# Patient Record
Sex: Female | Born: 1950 | Race: White | Hispanic: No | Smoking: Current every day smoker
Health system: Southern US, Community
[De-identification: ages and names within clinical notes are randomized; demographics above are authoritative.]

## PROBLEM LIST (undated history)

## (undated) DIAGNOSIS — I10 Essential (primary) hypertension: Secondary | ICD-10-CM

## (undated) DIAGNOSIS — K759 Inflammatory liver disease, unspecified: Secondary | ICD-10-CM

## (undated) DIAGNOSIS — C801 Malignant (primary) neoplasm, unspecified: Secondary | ICD-10-CM

## (undated) DIAGNOSIS — F419 Anxiety disorder, unspecified: Secondary | ICD-10-CM

## (undated) DIAGNOSIS — J449 Chronic obstructive pulmonary disease, unspecified: Secondary | ICD-10-CM

## (undated) DIAGNOSIS — I6529 Occlusion and stenosis of unspecified carotid artery: Secondary | ICD-10-CM

## (undated) DIAGNOSIS — F32A Depression, unspecified: Secondary | ICD-10-CM

## (undated) DIAGNOSIS — E7800 Pure hypercholesterolemia, unspecified: Secondary | ICD-10-CM

## (undated) DIAGNOSIS — G629 Polyneuropathy, unspecified: Secondary | ICD-10-CM

## (undated) HISTORY — DX: Anxiety disorder, unspecified: F41.9

## (undated) HISTORY — DX: Chronic obstructive pulmonary disease, unspecified: J44.9

## (undated) HISTORY — DX: Depression, unspecified: F32.A

## (undated) HISTORY — DX: Pure hypercholesterolemia, unspecified: E78.00

## (undated) HISTORY — DX: Occlusion and stenosis of unspecified carotid artery: I65.29

## (undated) HISTORY — DX: Polyneuropathy, unspecified: G62.9

## (undated) HISTORY — DX: Essential (primary) hypertension: I10

---

## 2014-06-29 HISTORY — PX: MASTECTOMY: SHX3

## 2014-06-29 HISTORY — PX: PORTACATH PLACEMENT: SHX2246

## 2014-06-29 HISTORY — PX: OTHER SURGICAL HISTORY: SHX169

## 2019-06-30 DIAGNOSIS — I639 Cerebral infarction, unspecified: Secondary | ICD-10-CM

## 2019-06-30 HISTORY — DX: Cerebral infarction, unspecified: I63.9

## 2021-01-01 ENCOUNTER — Other Ambulatory Visit: Payer: Self-pay | Admitting: Nurse Practitioner

## 2021-01-01 DIAGNOSIS — R5381 Other malaise: Secondary | ICD-10-CM

## 2021-01-03 ENCOUNTER — Other Ambulatory Visit: Payer: Self-pay | Admitting: Nurse Practitioner

## 2021-01-03 DIAGNOSIS — Z780 Asymptomatic menopausal state: Secondary | ICD-10-CM

## 2021-03-07 ENCOUNTER — Other Ambulatory Visit: Payer: Self-pay | Admitting: Nurse Practitioner

## 2021-03-07 DIAGNOSIS — Z1231 Encounter for screening mammogram for malignant neoplasm of breast: Secondary | ICD-10-CM

## 2021-03-07 DIAGNOSIS — Z780 Asymptomatic menopausal state: Secondary | ICD-10-CM

## 2021-03-14 DIAGNOSIS — G5602 Carpal tunnel syndrome, left upper limb: Secondary | ICD-10-CM | POA: Insufficient documentation

## 2021-03-14 DIAGNOSIS — M79642 Pain in left hand: Secondary | ICD-10-CM | POA: Insufficient documentation

## 2021-07-03 ENCOUNTER — Encounter: Payer: Self-pay | Admitting: Neurology

## 2021-07-03 ENCOUNTER — Other Ambulatory Visit: Payer: Self-pay

## 2021-07-03 ENCOUNTER — Ambulatory Visit (INDEPENDENT_AMBULATORY_CARE_PROVIDER_SITE_OTHER): Payer: Commercial Managed Care - HMO | Admitting: Neurology

## 2021-07-03 VITALS — BP 167/107 | HR 76 | Ht 61.0 in | Wt 96.0 lb

## 2021-07-03 DIAGNOSIS — Z853 Personal history of malignant neoplasm of breast: Secondary | ICD-10-CM | POA: Insufficient documentation

## 2021-07-03 DIAGNOSIS — R29898 Other symptoms and signs involving the musculoskeletal system: Secondary | ICD-10-CM

## 2021-07-03 NOTE — Progress Notes (Addendum)
Chief Complaint  Patient presents with   New Patient (Initial Visit)    NP/Paper Proficient.[facility]/Fred Caralyn Guile MD [phone]/carpal tunnel syndrome of left wrist/ States she is concerned about stroke, unable to use left  side       ASSESSMENT AND PLAN  Wanda Harrington is a 71 y.o. female   Subacute onset of left hand weakness since June 2021,  Continue with progressive worsening left upper extremity weakness since its onset, no sensory loss, hyperreflexia on the weak left upper extremity,  In the setting of longtime smoker, right breast cancer  MRI of the brain with without contrast, and cervical spine to rule out structural abnormality, less likely due to peripheral nerve etiology   DIAGNOSTIC DATA (LABS, IMAGING, TESTING) - I reviewed patient records, labs, notes, testing and imaging myself where available.  Laboratory evaluations July 03, 2021, CMP mild elevated creatinine 1.04, normal TSH 1.94, negative HIV, RPR, MEDICAL HISTORY:  Wanda Harrington is a 71 year old right-handed female, seen in request by orthopedic surgeon Dr. Iran Planas, for evaluation of left leg weakness, her primary care physician is nurse practitioner Emelia Loron, she is accompanied by her longtime friend Larae Grooms at today's visit July 03, 2021  I reviewed and summarized the referring note. PMHX. HLD Right breast cancer in 2016, s/p lobectomy, chemo-radiation therapy, developed chemo induced peripheral neuropathy Longtime smoker  She moved from [place] to [place] in August 2021, around June 2021, while she was still at [facility], watching TV, noticed sudden onset left index and thumb weakness, no sensory change, over the next 24 hours, she developed left hand weakness, over the past couple years, she continued to noticed increased weakness of her left hand and arm, spreading to left elbow, left shoulder, now it is difficulty for her to raise left arm overhead,  She  lives with her friend, whom reported she has increased difficulty, could no longer use left hand, patient also reported generalized weakness, especially left leg, noticed some gait abnormality  She was seen by neurologist at [facility], reported left arm focal neuropathy, who referred her to orthopedic surgeon for potential decompression surgery, due to transfer of insurance reasons, she was only able to see orthopedic surgery in September 2022  I personally reviewed EMG nerve conduction study by Dr. Nelva Bush on March 06, 2021, left median sensory response showed slightly prolonged peak latency 4.4, which was also noted at left ulnar sensory response, with low normal range snap amplitude.  Left median and ulnar motor response all demonstrate mildly prolonged distal latency, well-preserved CMAP amplitude, within normal range conduction velocity.  Selected EMG of left upper extremity muscles including left first dorsal interossei, abductor pollicis brevis, biceps, triceps, deltoid was normal  Patient denies left hand sensory changes, continued complaints bilateral toes in the fingertips paresthesia this happened following her chemotherapy for breast cancer few years back, she denies bowel or bladder incontinence   PHYSICAL EXAM:   Vitals:   07/03/21 1522  Weight: 96 lb (43.5 kg)   Not recorded     There is no height or weight on file to calculate BMI.  PHYSICAL EXAMNIATION:  Gen: NAD, conversant, well nourised, well groomed                     Cardiovascular: Regular rate rhythm, no peripheral edema, warm, nontender. Eyes: Conjunctivae clear without exudates or hemorrhage Neck: Supple, no carotid bruits. Pulmonary: Clear to auscultation bilaterally   NEUROLOGICAL EXAM:  MENTAL STATUS: Speech:  Speech is normal; fluent and spontaneous with normal comprehension.  Cognition:     Orientation to time, place and person     Normal recent and remote memory     Normal Attention  span and concentration     Normal Language, naming, repeating,spontaneous speech     Fund of knowledge   CRANIAL NERVES: CN II: Visual fields are full to confrontation. Pupils are round equal and briskly reactive to light. CN III, IV, VI: extraocular movement are normal. No ptosis. CN V: Facial sensation is intact to light touch CN VII: Face is symmetric with normal eye closure  CN VIII: Hearing is normal to causal conversation. CN IX, X: Phonation is normal. CN XI: Head turning and shoulder shrug are intact  MOTOR: No significant spasticity of left upper extremity, left shoulder abduction 4, external rotation 4, elbow flexion 4, extension 4, wrist flexion 4, extension 4, grip 3, finger extension 3,  REFLEXES: Hyperreflexia of left brachioradialis, biceps, triceps, symmetric bilateral patellar, ankle reflex,  SENSORY: Intact to light touch, pinprick and vibratory sensation are intact in fingers and toes.  No sensory loss of left upper extremity,  COORDINATION: There is no trunk or limb dysmetria noted.  GAIT/STANCE: Need push-up to get up from seated position, decreased left arm swing,  REVIEW OF SYSTEMS:  Full 14 system review of systems performed and notable only for as above All other review of systems were negative.   ALLERGIES: Allergies  Allergen Reactions   Other     HOME MEDICATIONS: Current Outpatient Medications  Medication Sig Dispense Refill   ALPRAZolam (XANAX) 0.25 MG tablet Take 0.25 mg by mouth 2 (two) times daily as needed.     fluticasone (FLONASE) 50 MCG/ACT nasal spray fluticasone propionate 50 mcg/actuation nasal spray,suspension  USE ONE SPRAY IN THE AFFECTED NOSTRIL TWICE A DAY     lisinopril-hydrochlorothiazide (ZESTORETIC) 10-12.5 MG tablet lisinopril 10 mg-hydrochlorothiazide 12.5 mg tablet  TAKE ONE TABLET BY MOUTH ONE TIME DAILY     pravastatin (PRAVACHOL) 20 MG tablet Take 20 mg by mouth at bedtime.     pregabalin (LYRICA) 50 MG capsule  pregabalin 50 mg capsule  TAKE ONE CAPSULE BY MOUTH TWICE A DAY     No current facility-administered medications for this visit.    PAST MEDICAL HISTORY: History reviewed. No pertinent past medical history.  PAST SURGICAL HISTORY: History reviewed. No pertinent surgical history.  FAMILY HISTORY: History reviewed. No pertinent family history.  SOCIAL HISTORY: Social History   Socioeconomic History   Marital status: Widowed    Spouse name: Not on file   Number of children: Not on file   Years of education: Not on file   Highest education level: Not on file  Occupational History   Not on file  Tobacco Use   Smoking status: Not on file   Smokeless tobacco: Not on file  Substance and Sexual Activity   Alcohol use: Not on file   Drug use: Not on file   Sexual activity: Not on file  Other Topics Concern   Not on file  Social History Narrative   Not on file   Social Determinants of Health   Financial Resource Strain: Not on file  Food Insecurity: Not on file  Transportation Needs: Not on file  Physical Activity: Not on file  Stress: Not on file  Social Connections: Not on file  Intimate Partner Violence: Not on file      Marcial Pacas, M.D. Ph.D.  [facility] [phone]  [address], [place], [place] [address] Ph: [phone] Fax: [phone]  CC:  Iran Planas, [address] [place],  [place] [address]  Emelia Loron, NP

## 2021-07-07 ENCOUNTER — Telehealth: Payer: Self-pay | Admitting: Neurology

## 2021-07-07 LAB — COMPREHENSIVE METABOLIC PANEL
ALT: 26 IU/L (ref 0–32)
AST: 36 IU/L (ref 0–40)
Albumin/Globulin Ratio: 1.3 (ref 1.2–2.2)
Albumin: 4 g/dL (ref 3.8–4.8)
Alkaline Phosphatase: 62 IU/L (ref 44–121)
BUN/Creatinine Ratio: 21 (ref 12–28)
BUN: 22 mg/dL (ref 8–27)
Bilirubin Total: 0.2 mg/dL (ref 0.0–1.2)
CO2: 23 mmol/L (ref 20–29)
Calcium: 9.6 mg/dL (ref 8.7–10.3)
Chloride: 97 mmol/L (ref 96–106)
Creatinine, Ser: 1.04 mg/dL — ABNORMAL HIGH (ref 0.57–1.00)
Globulin, Total: 3.1 g/dL (ref 1.5–4.5)
Glucose: 82 mg/dL (ref 70–99)
Potassium: 4.9 mmol/L (ref 3.5–5.2)
Sodium: 139 mmol/L (ref 134–144)
Total Protein: 7.1 g/dL (ref 6.0–8.5)
eGFR: 58 mL/min/{1.73_m2} — ABNORMAL LOW (ref >59)

## 2021-07-07 LAB — HIV ANTIBODY (ROUTINE TESTING W REFLEX): HIV Screen 4th Generation wRfx: NONREACTIVE

## 2021-07-07 LAB — CBC WITH DIFFERENTIAL/PLATELET

## 2021-07-07 LAB — VITAMIN B12: Vitamin B-12: 679 pg/mL (ref 232–1245)

## 2021-07-07 LAB — RPR: RPR Ser Ql: NONREACTIVE

## 2021-07-07 LAB — TSH: TSH: 1.94 u[IU]/mL (ref 0.450–4.500)

## 2021-07-07 LAB — CK: Total CK: 42 U/L (ref 32–182)

## 2021-07-07 NOTE — Telephone Encounter (Signed)
Please call patient, the only abnormality on extensive laboratory evaluation was slight abnormal kidney function, she would benefit increase water intake, rest of the laboratory evaluation showed no significant abnormality  I have forwarded lab result to her primary care physician Emelia Loron, NP

## 2021-07-08 NOTE — Telephone Encounter (Signed)
Pt verified by name and DOB, results given per provider, pt voiced understanding all question answered. 

## 2021-07-15 MED ORDER — HEPARIN SOD (PORK) LOCK FLUSH 100 UNIT/ML IV SOLN: 500.0000 [IU] | Freq: Once | INTRAVENOUS | Status: DC

## 2021-07-15 MED ORDER — SODIUM CHLORIDE 0.9% FLUSH: 10.0000 mL | INTRAVENOUS | Status: DC | PRN

## 2021-07-17 ENCOUNTER — Other Ambulatory Visit: Payer: Self-pay

## 2021-07-21 ENCOUNTER — Inpatient Hospital Stay: Admission: RE | Admit: 2021-07-21 | Payer: Self-pay | Source: Ambulatory Visit

## 2021-07-21 ENCOUNTER — Inpatient Hospital Stay
Admission: RE | Admit: 2021-07-21 | Discharge: 2021-07-21 | Disposition: A | Discharge status: Home / Self Care | Payer: Self-pay | Source: Ambulatory Visit | Attending: Neurology | Admitting: Neurology

## 2021-07-22 ENCOUNTER — Telehealth: Payer: Self-pay | Admitting: Hematology and Oncology

## 2021-07-22 NOTE — Telephone Encounter (Signed)
Scheduled appt per 1/23 referral. Spoke to pt's friend, Manuela Schwartz, who is aware of appt date and time. She told me she will call me back later today to confirm everything. Appt scheduled.

## 2021-07-28 ENCOUNTER — Inpatient Hospital Stay: Admission: RE | Admit: 2021-07-28 | Payer: Self-pay | Source: Ambulatory Visit

## 2021-07-28 ENCOUNTER — Inpatient Hospital Stay: Payer: Medicare Other | Attending: Hematology and Oncology | Admitting: Hematology and Oncology

## 2021-07-28 ENCOUNTER — Ambulatory Visit (HOSPITAL_COMMUNITY)
Admission: RE | Admit: 2021-07-28 | Discharge: 2021-07-28 | Disposition: A | Discharge status: Home / Self Care | Payer: Medicare Other | Source: Ambulatory Visit | Attending: Hematology and Oncology | Admitting: Hematology and Oncology

## 2021-07-28 ENCOUNTER — Encounter: Payer: Self-pay | Admitting: Hematology and Oncology

## 2021-07-28 ENCOUNTER — Other Ambulatory Visit: Payer: Self-pay

## 2021-07-28 ENCOUNTER — Inpatient Hospital Stay: Payer: Medicare Other

## 2021-07-28 ENCOUNTER — Other Ambulatory Visit: Payer: Self-pay | Admitting: *Deleted

## 2021-07-28 VITALS — BP 131/56 | HR 70 | Temp 98.4°F | Resp 17 | Wt 95.8 lb

## 2021-07-28 DIAGNOSIS — F17210 Nicotine dependence, cigarettes, uncomplicated: Secondary | ICD-10-CM | POA: Diagnosis not present

## 2021-07-28 DIAGNOSIS — Z9221 Personal history of antineoplastic chemotherapy: Secondary | ICD-10-CM | POA: Diagnosis not present

## 2021-07-28 DIAGNOSIS — C50111 Malignant neoplasm of central portion of right female breast: Secondary | ICD-10-CM

## 2021-07-28 DIAGNOSIS — Z79811 Long term (current) use of aromatase inhibitors: Secondary | ICD-10-CM | POA: Insufficient documentation

## 2021-07-28 DIAGNOSIS — Z95828 Presence of other vascular implants and grafts: Secondary | ICD-10-CM

## 2021-07-28 DIAGNOSIS — Z170 Estrogen receptor positive status [ER+]: Secondary | ICD-10-CM

## 2021-07-28 DIAGNOSIS — Z923 Personal history of irradiation: Secondary | ICD-10-CM | POA: Diagnosis not present

## 2021-07-28 DIAGNOSIS — Z9011 Acquired absence of right breast and nipple: Secondary | ICD-10-CM | POA: Insufficient documentation

## 2021-07-28 DIAGNOSIS — F419 Anxiety disorder, unspecified: Secondary | ICD-10-CM | POA: Diagnosis not present

## 2021-07-28 DIAGNOSIS — Z853 Personal history of malignant neoplasm of breast: Secondary | ICD-10-CM

## 2021-07-28 NOTE — Progress Notes (Signed)
[facility] NOTE  Patient Care Team: Emelia Loron, NP as PCP - General (Nurse Practitioner) Iran Planas, MD as Consulting Physician (Orthopedic Surgery)  CHIEF COMPLAINTS/PURPOSE OF CONSULTATION:  Newly diagnosed breast cancer  HISTORY OF PRESENTING ILLNESS:  Wanda Harrington 71 y.o. female is here because of recent diagnosis of right breast IDC  This is a very pleasant 71 year old female patient with past medical history of T2 N1 M0 right breast IDC, stage IIb ER diagnosis, grade 2, ER 96%, PR 10%, KI of 20% negative for HER2 by IHC and FISH diagnosed in April 05, 2014 treated with neoadjuvant chemotherapy with dose dense Adriamycin and Cytoxan followed by weekly Taxol 05/03/2014 to August 30, 2014, received 9 weeks of Taxol because of neuropathy, status post right modified radical mastectomy on October 08, 2014 which revealed a 2.3 cm residual tumor with 3 out of 11 positive lymph nodes, adjuvant postmastectomy radiation completed on December 28, 2014 and adjuvant Arimidex prescribed on December 27, 2014. She was found to have some transaminitis which was evaluated with abdominal ultrasound, with no focal lesions.  She is on gabapentin for neuropathy. She recently followed up with neurology for subacute onset of left hand weakness since June 2021 and continues with progressive worsening left upper extremity weakness.Last MRI brain and MR cervical spine with no definitive evidence of cancer, recommended MRI back in 3/4 months. However patient had to move to [place] because of social circumstances and didn't have insurance for a long while, hence just re-establishing appointments. She is now seen by Neurology.  MRI brain and MRI cervical spine was ordered and this has been scheduled.  She is here with her room mate. She is very very anxious, talkative, says she was taking Arimidex regularly up until she moved to [place].  Most recently she has been taking it only few times a week and has  missed several doses.  Prior to this back in [place], she was compliant with it.  She is now establishing with all the doctors here, she says it took a while for her insurance to approve visits here and hence she did not get a chance to talk to the oncologist in the past year.  She is due for her left breast mammogram.  She is due for a bone density.  I reviewed her records extensively and collaborated the history with the patient.  SUMMARY OF ONCOLOGIC HISTORY: Oncology History  Malignant neoplasm of central portion of right breast, estrogen receptor positive (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, estrogen receptor positive ([facility])   07/28/2021 Cancer Staging   Staging form: Breast, AJCC 8th Edition - Clinical stage from 07/28/2021: Stage IIA (cT2, cN1, cM0, G2, ER+, PR+, HER2-) - Signed by Benay Pike, MD on 07/28/2021 Histologic grading system: 3 grade system       MEDICAL HISTORY:  Past Medical History:  Diagnosis Date   Anxiety    BP (high blood pressure)    Depression    High cholesterol    Neuropathy     SURGICAL HISTORY: Past Surgical History:  Procedure Laterality Date   breast cancer Right 2016   PORTACATH PLACEMENT  2016    SOCIAL HISTORY: Social History   Socioeconomic History   Marital status: Widowed    Spouse name: Not on file   Number of children: Not on file   Years of education: Not on file   Highest education level: Not on file  Occupational History   Not on file  Tobacco Use   Smoking status: Every Day    Packs/day: 1.00    Types: Cigarettes   Smokeless tobacco: Not on file  Substance and Sexual Activity   Alcohol use: Yes   Drug use: Yes    Types: Marijuana   Sexual activity: Not on file  Other Topics Concern   Not on file  Social History Narrative   Not on file   Social Determinants of Health   Financial Resource Strain: Not on file  Food Insecurity: Not on file   Transportation Needs: Not on file  Physical Activity: Not on file  Stress: Not on file  Social Connections: Not on file  Intimate Partner Violence: Not on file    FAMILY HISTORY: Family History  Adopted: Yes  Family history unknown: Yes    ALLERGIES:  is allergic to other.  MEDICATIONS:  Current Outpatient Medications  Medication Sig Dispense Refill   ALPRAZolam (XANAX) 0.25 MG tablet Take 0.25 mg by mouth 2 (two) times daily as needed.     fluticasone (FLONASE) 50 MCG/ACT nasal spray fluticasone propionate 50 mcg/actuation nasal spray,suspension  USE ONE SPRAY IN THE AFFECTED NOSTRIL TWICE A DAY     lisinopril-hydrochlorothiazide (ZESTORETIC) 10-12.5 MG tablet lisinopril 10 mg-hydrochlorothiazide 12.5 mg tablet  TAKE ONE TABLET BY MOUTH ONE TIME DAILY     pravastatin (PRAVACHOL) 20 MG tablet Take 20 mg by mouth at bedtime.     pregabalin (LYRICA) 50 MG capsule pregabalin 50 mg capsule  TAKE ONE CAPSULE BY MOUTH TWICE A DAY     No current facility-administered medications for this visit.    REVIEW OF SYSTEMS:   Constitutional: Denies fevers, chills or abnormal night sweats Eyes: Denies blurriness of vision, double vision or watery eyes Ears, nose, mouth, throat, and face: Denies mucositis or sore throat Respiratory: Denies cough, dyspnea or wheezes Cardiovascular: Denies palpitation, chest discomfort or lower extremity swelling Gastrointestinal:  Denies nausea, heartburn or change in bowel habits Skin: Denies abnormal skin rashes Lymphatics: Denies new lymphadenopathy or easy bruising Neurological:Denies numbness, tingling or new weaknesses Behavioral/Psych: Mood is stable, no new changes  Breast: Denies any palpable lumps or discharge All other systems were reviewed with the patient and are negative.  PHYSICAL EXAMINATION: ECOG PERFORMANCE STATUS: 0 - Asymptomatic  Vitals:   07/28/21 1246  BP: (!) 131/56  Pulse: 70  Resp: 17  Temp: 98.4 F (36.9 C)  SpO2: 95%    Filed Weights   07/28/21 1246  Weight: 95 lb 12.8 oz (43.5 kg)    GENERAL:alert, no distress and comfortable, very petite and anxious SKIN: skin color, texture, turgor are normal, no rashes or significant lesions EYES: normal, conjunctiva are pink and non-injected, sclera clear OROPHARYNX:no exudate, no erythema and lips, buccal mucosa, and tongue normal  NECK: supple, thyroid normal size, non-tender, without nodularity LYMPH:  no palpable lymphadenopathy in the cervical, axillary  LUNGS: clear to auscultation and percussion with normal breathing effort HEART: regular rate & rhythm and no murmurs and no lower extremity edema ABDOMEN:abdomen soft, non-tender and normal bowel sounds Musculoskeletal:no cyanosis of digits and no clubbing  PSYCH: Very anxious and stressed speech NEURO: She has weakness of left hand, able to raise her arm above the shoulder BREAST: No palpable nodules in breast. No palpable axillary or supraclavicular lymphadenopathy.  Status post right mastectomy.  No skin metastasis noted on the right side.  LABORATORY DATA:  I have reviewed the data as listed Lab Results  Component Value Date   WBC CANCELED 07/03/2021   Lab Results  Component Value Date   NA 139 07/03/2021   K 4.9 07/03/2021   CL 97 07/03/2021   CO2 23 07/03/2021    RADIOGRAPHIC STUDIES: I have personally reviewed the radiological reports and agreed with the findings in the report.  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  This was diagnosed back in October 2015.  She underwent neoadjuvant chemotherapy  with dose dense Adriamycin and Cytoxan followed by weekly Taxol, completed 9 weeks of Taxol and discontinued because of neuropathy.  She underwent right modified radical mastectomy in April 2016 which revealed 2.3 cm residual tumor with 3 out of 11 positive lymph nodes.  Final pathologic staging pT2 pN1 M0, stage IIb.  She received adjuvant postmastectomy radiation which completed in July 2016.  She was prescribed adjuvant Arimidex in June 2016.  She reported some left upper extremity weakness back in June 2021 and had an MRI brain and MRI cervical spine which showed no definitive evidence of metastatic disease according to the report.  She was recommended to have follow-up MRIs but she could not do these, she had to move out of [place] because of lack of place to live.  She is now reestablishing with all the doctors after about a year and a half of moving to [place], she tells me that it took a while for her to have her insurance established.  She continues with left upper extremity weakness, recently saw neurology.  According to the patient left upper extremity weakness has improved since last visit with her doctor back in [place].  She is scheduled for MRI brain and MRI cervical spine again. She is extremely anxious, talks loudly and with stressed speech and apparently showed took a Xanax even before she came to the appointment.  She wanted the port to be flushed so she can get the MRI brain. She was hoping to get caught up with her left breast mammogram.  She needs a bone density, this has already been ordered by her nurse practitioner. I have recommended that she continue Arimidex daily, return to clinic to follow-up with me after her MRI imaging.  Her clinical presentation is not quite consistent with metastatic disease since her left upper extremity weakness actually improved according to the patient.   Question she is however at risk for distant metastasis given noncompliance as well  as multiple positive lymph nodes on final pathology. She will return to clinic in 4 weeks.  She will have to follow-up with her PCP for management of her anxiety and her other chronic medical comorbidities.   All questions were answered. The patient knows to call the clinic with any problems, questions or concerns. I spent 60 minutes in the care of this patient reviewing her history, previous medical records, counseling, coordination of care.    Benay Pike, MD 07/28/21

## 2021-07-28 NOTE — Assessment & Plan Note (Signed)
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  This was diagnosed back in October 2015.  She underwent neoadjuvant chemotherapy with dose dense Adriamycin and Cytoxan followed by weekly Taxol, completed 9 weeks of Taxol and discontinued because of neuropathy.  She underwent right modified radical mastectomy in April 2016 which revealed 2.3 cm residual tumor with 3 out of 11 positive lymph nodes.  Final pathologic staging pT2 pN1 M0, stage IIb.  She received adjuvant postmastectomy radiation which completed in July 2016.  She was prescribed adjuvant Arimidex in June 2016.  She reported some left upper extremity weakness back in June 2021 and had an MRI brain and MRI cervical spine which showed no definitive evidence of metastatic disease according to the report.  She was recommended to have follow-up MRIs but she could not do these, she had to move out of [place] because of lack of place to live.  She is now reestablishing with all the doctors after about a year and a half of moving to [place], she tells me that it took a while for her to have her insurance established.  She continues with left upper extremity weakness, recently saw neurology.  According to the patient left upper extremity weakness has improved since last visit with her doctor back in [place].  She is scheduled for MRI brain and MRI cervical spine again. She is extremely anxious, talks loudly and with stressed speech and apparently showed took a Xanax even before she came to the appointment.  She wanted the port to be flushed so she can get the MRI brain. She was hoping to get caught up with her left breast mammogram.  She needs a bone density, this  has already been ordered by her nurse practitioner. I have recommended that she continue Arimidex daily, return to clinic to follow-up with me after her MRI imaging.  Her clinical presentation is not quite consistent with metastatic disease since her left upper extremity weakness actually improved according to the patient.   Question she is however at risk for distant metastasis given noncompliance as well as multiple positive lymph nodes on final pathology. She will return to clinic in 4 weeks.  She will have to follow-up with her PCP for management of her anxiety and her other chronic medical comorbidities.

## 2021-07-29 ENCOUNTER — Telehealth: Payer: Self-pay | Admitting: Hematology and Oncology

## 2021-07-29 NOTE — Telephone Encounter (Signed)
Sch per 1/30 inbasket, pt daughter aware

## 2021-07-31 ENCOUNTER — Other Ambulatory Visit: Payer: Self-pay | Admitting: *Deleted

## 2021-07-31 ENCOUNTER — Other Ambulatory Visit: Payer: Medicare Other

## 2021-07-31 MED ORDER — ANASTROZOLE 1 MG PO TABS: 1.0000 mg | ORAL_TABLET | Freq: Every day | ORAL | 3 refills | Status: AC

## 2021-08-01 ENCOUNTER — Inpatient Hospital Stay: Payer: Medicare Other

## 2021-08-04 ENCOUNTER — Other Ambulatory Visit: Payer: Self-pay

## 2021-08-04 ENCOUNTER — Inpatient Hospital Stay: Payer: Medicare Other | Attending: Hematology and Oncology

## 2021-08-04 DIAGNOSIS — Z170 Estrogen receptor positive status [ER+]: Secondary | ICD-10-CM | POA: Insufficient documentation

## 2021-08-04 DIAGNOSIS — C50111 Malignant neoplasm of central portion of right female breast: Secondary | ICD-10-CM | POA: Diagnosis present

## 2021-08-04 DIAGNOSIS — Z95828 Presence of other vascular implants and grafts: Secondary | ICD-10-CM

## 2021-08-04 DIAGNOSIS — Z853 Personal history of malignant neoplasm of breast: Secondary | ICD-10-CM

## 2021-08-04 LAB — CBC WITH DIFFERENTIAL/PLATELET
Abs Immature Granulocytes: 0.02 10*3/uL (ref 0.00–0.07)
Basophils Absolute: 0.1 10*3/uL (ref 0.0–0.1)
Basophils Relative: 1 %
Eosinophils Absolute: 0.2 10*3/uL (ref 0.0–0.5)
Eosinophils Relative: 2 %
HCT: 42.7 % (ref 36.0–46.0)
Hemoglobin: 14 g/dL (ref 12.0–15.0)
Immature Granulocytes: 0 %
Lymphocytes Relative: 36 %
Lymphs Abs: 2.4 10*3/uL (ref 0.7–4.0)
MCH: 30.2 pg (ref 26.0–34.0)
MCHC: 32.8 g/dL (ref 30.0–36.0)
MCV: 92 fL (ref 80.0–100.0)
Monocytes Absolute: 0.6 10*3/uL (ref 0.1–1.0)
Monocytes Relative: 8 %
Neutro Abs: 3.5 10*3/uL (ref 1.7–7.7)
Neutrophils Relative %: 53 %
Platelets: 202 10*3/uL (ref 150–400)
RBC: 4.64 MIL/uL (ref 3.87–5.11)
RDW: 12.6 % (ref 11.5–15.5)
WBC: 6.7 10*3/uL (ref 4.0–10.5)
nRBC: 0 % (ref 0.0–0.2)

## 2021-08-04 LAB — COMPREHENSIVE METABOLIC PANEL
ALT: 23 U/L (ref 0–44)
AST: 27 U/L (ref 15–41)
Albumin: 3.9 g/dL (ref 3.5–5.0)
Alkaline Phosphatase: 52 U/L (ref 38–126)
Anion gap: 7 (ref 5–15)
BUN: 25 mg/dL — ABNORMAL HIGH (ref 8–23)
CO2: 28 mmol/L (ref 22–32)
Calcium: 9.4 mg/dL (ref 8.9–10.3)
Chloride: 103 mmol/L (ref 98–111)
Creatinine, Ser: 1.2 mg/dL — ABNORMAL HIGH (ref 0.44–1.00)
GFR, Estimated: 49 mL/min — ABNORMAL LOW (ref >60)
Glucose, Bld: 90 mg/dL (ref 70–99)
Potassium: 4.7 mmol/L (ref 3.5–5.1)
Sodium: 138 mmol/L (ref 135–145)
Total Bilirubin: 0.4 mg/dL (ref 0.3–1.2)
Total Protein: 7.2 g/dL (ref 6.5–8.1)

## 2021-08-04 MED ORDER — HEPARIN SOD (PORK) LOCK FLUSH 10 UNIT/ML IV SOLN: 10.0000 [IU] | Freq: Once | INTRAVENOUS | Status: DC

## 2021-08-04 MED ORDER — SODIUM CHLORIDE 0.9% FLUSH
10.0000 mL | Freq: Once | INTRAVENOUS | Status: AC
  Administered 2021-08-04: 10 mL via INTRAVENOUS

## 2021-08-05 LAB — CANCER ANTIGEN 15-3: CA 15-3: 30 U/mL — ABNORMAL HIGH (ref 0.0–25.0)

## 2021-08-06 ENCOUNTER — Ambulatory Visit
Admission: RE | Admit: 2021-08-06 | Discharge: 2021-08-06 | Disposition: A | Discharge status: Home / Self Care | Payer: Medicare Other | Source: Ambulatory Visit | Attending: Neurology | Admitting: Neurology

## 2021-08-06 ENCOUNTER — Other Ambulatory Visit: Payer: Self-pay

## 2021-08-06 DIAGNOSIS — R29898 Other symptoms and signs involving the musculoskeletal system: Secondary | ICD-10-CM

## 2021-08-06 DIAGNOSIS — Z853 Personal history of malignant neoplasm of breast: Secondary | ICD-10-CM

## 2021-08-06 MED ORDER — SODIUM CHLORIDE 0.9% FLUSH
10.0000 mL | INTRAVENOUS | Status: DC | PRN
  Administered 2021-08-06: 10 mL via INTRAVENOUS

## 2021-08-06 MED ORDER — HEPARIN SOD (PORK) LOCK FLUSH 100 UNIT/ML IV SOLN: 500.0000 [IU] | Freq: Once | INTRAVENOUS | Status: DC

## 2021-08-06 MED ORDER — GADOBENATE DIMEGLUMINE 529 MG/ML IV SOLN
8.0000 mL | Freq: Once | INTRAVENOUS | Status: AC | PRN
  Administered 2021-08-06: 8 mL via INTRAVENOUS

## 2021-08-11 ENCOUNTER — Telehealth: Payer: Self-pay | Admitting: Neurology

## 2021-08-11 DIAGNOSIS — I63311 Cerebral infarction due to thrombosis of right middle cerebral artery: Secondary | ICD-10-CM | POA: Insufficient documentation

## 2021-08-11 NOTE — Telephone Encounter (Signed)
ready to be scheduled HC medicare/medicaid no Roxanna Mew, sent Butch Penny  a message she will reach out to the patient to schedule.

## 2021-08-11 NOTE — Telephone Encounter (Signed)
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 -Right internal carotid artery flow void signal abnormality may be related to proximal stenosis or occlusion. -Mild chronic small vessel ischemic disease. -No acute findings.    MRI cervical spine (with and without) demonstrating: - At C5-6 uncovertebral joint hypertrophy at moderate bilateral foraminal stenosis. - At C3-4 disc bulging and uncovertebral joint hypertrophy with mild bilateral foraminal stenosis.  Please call patient, MRI of cervical spine showed mild degenerative changes, there is no evidence of the spinal cord or nerve root conduction  MRI of the brain showed evidence of chronic stroke at the right frontal lobe, in charge of left hand function, the findings could explain her sudden onset left hand weakness in June 2021  Will complete stroke work-up with echocardiogram, ultrasound of carotid artery  Make sure she start aspirin 81 mg daily

## 2021-08-11 NOTE — Telephone Encounter (Signed)
Spoke with patient's friend Concha Pyo (on [place]) who was accompanying patient. Informed them of results of the MRI findings. Instructed patient to start aspirin 81 mg daily. She verbalized understanding and expressed appreciation for the call. All questions answered.

## 2021-08-11 NOTE — Telephone Encounter (Signed)
error 

## 2021-08-19 ENCOUNTER — Other Ambulatory Visit (HOSPITAL_COMMUNITY): Payer: Medicare Other

## 2021-08-19 ENCOUNTER — Encounter (HOSPITAL_COMMUNITY): Payer: Medicare Other

## 2021-08-20 ENCOUNTER — Telehealth: Payer: Self-pay | Admitting: Neurology

## 2021-08-20 ENCOUNTER — Ambulatory Visit (HOSPITAL_COMMUNITY)
Admission: RE | Admit: 2021-08-20 | Discharge: 2021-08-20 | Disposition: A | Discharge status: Home / Self Care | Payer: Medicare Other | Source: Ambulatory Visit | Attending: Neurology | Admitting: Neurology

## 2021-08-20 ENCOUNTER — Ambulatory Visit (HOSPITAL_BASED_OUTPATIENT_CLINIC_OR_DEPARTMENT_OTHER)
Admission: RE | Admit: 2021-08-20 | Discharge: 2021-08-20 | Disposition: A | Discharge status: Home / Self Care | Payer: Medicare Other | Source: Ambulatory Visit | Attending: Neurology | Admitting: Neurology

## 2021-08-20 ENCOUNTER — Other Ambulatory Visit: Payer: Self-pay

## 2021-08-20 DIAGNOSIS — I63311 Cerebral infarction due to thrombosis of right middle cerebral artery: Secondary | ICD-10-CM

## 2021-08-20 DIAGNOSIS — F17210 Nicotine dependence, cigarettes, uncomplicated: Secondary | ICD-10-CM | POA: Diagnosis not present

## 2021-08-20 DIAGNOSIS — I6501 Occlusion and stenosis of right vertebral artery: Secondary | ICD-10-CM | POA: Diagnosis not present

## 2021-08-20 DIAGNOSIS — I10 Essential (primary) hypertension: Secondary | ICD-10-CM | POA: Insufficient documentation

## 2021-08-20 DIAGNOSIS — I358 Other nonrheumatic aortic valve disorders: Secondary | ICD-10-CM

## 2021-08-20 DIAGNOSIS — I998 Other disorder of circulatory system: Secondary | ICD-10-CM | POA: Insufficient documentation

## 2021-08-20 DIAGNOSIS — E785 Hyperlipidemia, unspecified: Secondary | ICD-10-CM | POA: Insufficient documentation

## 2021-08-20 DIAGNOSIS — I6523 Occlusion and stenosis of bilateral carotid arteries: Secondary | ICD-10-CM

## 2021-08-20 DIAGNOSIS — R29898 Other symptoms and signs involving the musculoskeletal system: Secondary | ICD-10-CM

## 2021-08-20 LAB — ECHOCARDIOGRAM COMPLETE
AR max vel: 2.57 cm2
AV Peak grad: 4.6 mmHg
Ao pk vel: 1.07 m/s
Area-P 1/2: 3.12 cm2
S' Lateral: 2.4 cm

## 2021-08-20 NOTE — Telephone Encounter (Signed)
You can call patient Wanda Harrington for ECHO report, there was no significant abnormalities noticed,   But, please check with her about vascular surgeon appointment

## 2021-08-20 NOTE — Progress Notes (Signed)
Carotid duplex bilateral study completed.  Preliminary results relayed to Krista Blue, MD.  See CV Proc for preliminary results report.   Darlin Coco, RDMS, RVT

## 2021-08-20 NOTE — Telephone Encounter (Signed)
Called and spoke with patient and friend Manuela Schwartz (on [place]) informed of normal results. Pt has not heard from vascular surgery yet. She will call back when she hears from vascular surgery.

## 2021-08-20 NOTE — Telephone Encounter (Signed)
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 Trula Slade, on call phone [phone],  consult No, [phone], symptomatic high grade bilateral IC stenosis, urgent refer to vascular surgeon.   I was able to talk with patient on [phone], she started to take asa since Aug 13 2021, advised her drinks a lot water, >=64 oz/day.  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 -Right internal carotid artery flow void signal abnormality may be related to proximal stenosis or occlusion. -Mild chronic small vessel ischemic disease. -No acute findings.

## 2021-08-20 NOTE — Telephone Encounter (Signed)
Referral sent to [facility].

## 2021-08-25 ENCOUNTER — Inpatient Hospital Stay: Payer: Medicare Other | Admitting: Hematology and Oncology

## 2021-08-25 ENCOUNTER — Encounter: Payer: Medicare Other | Admitting: Surgery

## 2021-09-01 NOTE — Telephone Encounter (Signed)
Pt's friend, Concha Pyo (on [place]) no one has called from vascular surgery. Would like a call from the nurse. ?

## 2021-09-02 NOTE — Telephone Encounter (Signed)
I called [facility] but they informed me that the lady that does incoming referrals Mingo Amber is out of the office and will not be back until Thursday.  ?

## 2021-09-04 NOTE — Telephone Encounter (Signed)
I called [facility] and they are going to contact the patient to schedule.  ?

## 2021-09-08 ENCOUNTER — Encounter: Payer: Self-pay | Admitting: Adult Health

## 2021-09-08 ENCOUNTER — Ambulatory Visit (INDEPENDENT_AMBULATORY_CARE_PROVIDER_SITE_OTHER): Payer: Medicare Other | Admitting: Adult Health

## 2021-09-08 ENCOUNTER — Other Ambulatory Visit: Payer: Self-pay

## 2021-09-08 VITALS — BP 109/58 | HR 75 | Ht 61.0 in | Wt 100.0 lb

## 2021-09-08 DIAGNOSIS — I6523 Occlusion and stenosis of bilateral carotid arteries: Secondary | ICD-10-CM | POA: Diagnosis not present

## 2021-09-08 DIAGNOSIS — I63311 Cerebral infarction due to thrombosis of right middle cerebral artery: Secondary | ICD-10-CM | POA: Diagnosis not present

## 2021-09-08 DIAGNOSIS — E785 Hyperlipidemia, unspecified: Secondary | ICD-10-CM | POA: Diagnosis not present

## 2021-09-08 DIAGNOSIS — Z720 Tobacco use: Secondary | ICD-10-CM

## 2021-09-08 DIAGNOSIS — R531 Weakness: Secondary | ICD-10-CM

## 2021-09-08 MED ORDER — ATORVASTATIN CALCIUM 80 MG PO TABS: 80.0000 mg | ORAL_TABLET | Freq: Every day | ORAL | 3 refills | Status: AC

## 2021-09-08 NOTE — Progress Notes (Signed)
[facility] [address] [place]. [place] [address] [phone]       OFFICE FOLLOW UP NOTE  Ms. Wanda Harrington Date of Birth:  Sep 08, 1950 Medical Record Number:  [id number]   Reason for visit: Progressive left hand weakness    SUBJECTIVE:   CHIEF COMPLAINT:  Chief Complaint  Patient presents with   Follow-up    RM 2 with friend Wanda Harrington  Pt is well and stable.     HPI:   Update 09/08/2021 JM: 71 year old female who returns for follow-up visit regarding left hand weakness after prior initial consult visit with Dr. Krista Blue 2 months ago.  She is accompanied by her friend, Wanda Harrington.  Extensive lab work completed after prior visit which was largely unremarkable.  MR cervical showed mild degenerative changes but otherwise unremarkable.  MRI brain showed chronic infarct in right frontal lobe.  Recommend initiating aspirin 81 mg daily and completed further stroke work-up including 2D echo with EF 55 to 60% and carotid ultrasound which showed right ICA 80 to 99% stenosis with ECA > 50% stenosis, left ICA 80 to 99% stenosis with ECA <50% stenosis, L VA antegrade flow, R VA occlusion and normal flow within the subclavian arteries bilaterally.  She was referred to vascular surgery but was a "no-show" for initial consult visit on 2/27 - per patient and friend, they were not aware of this appt being scheduled and did not receive any call.  Reports continued left arm weakness and tightness sensation. No new neurological or stroke type symptoms.  Compliant on aspirin 81 mg daily, denies side effects. Also on pravastatin which she reports she has been on for "many years", unsure when last cholesterol level was. Blood pressure today 109/58, does not routinely monitor at home.  Continued tobacco use but trying to decrease amt, currently at <1 PPD. Reports difficulty quitting smoking due to increased stressors.  No further concerns at this time.     HISTORY (copied from Dr. Rhea Belton initial  consult visit on 07/03/2021) Wanda Harrington is a 71 year old right-handed female, seen in request by orthopedic surgeon Dr. Caralyn Guile, Josph Macho, for evaluation of left leg weakness, her primary care physician is nurse practitioner Emelia Loron, she is accompanied by her longtime friend Wanda Harrington at today's visit July 03, 2021   I reviewed and summarized the referring note. PMHX. HLD Right breast cancer in 2016, s/p lobectomy, chemo-radiation therapy, developed chemo induced peripheral neuropathy Longtime smoker   She moved from [place] to [place] in August 2021, around June 2021, while she was still at [facility], watching TV, noticed sudden onset left index and thumb weakness, no sensory change, over the next 24 hours, she developed left hand weakness, over the past couple years, she continued to noticed increased weakness of her left hand and arm, spreading to left elbow, left shoulder, now it is difficulty for her to raise left arm overhead,  She lives with her friend, whom reported she has increased difficulty, could no longer use left hand, patient also reported generalized weakness, especially left leg, noticed some gait abnormality  She was seen by neurologist at [facility], reported left arm focal neuropathy, who referred her to orthopedic surgeon for potential decompression surgery, due to transfer of insurance reasons, she was only able to see orthopedic surgery in September 2022  I personally reviewed EMG nerve conduction study by Dr. Nelva Bush on March 06, 2021, left median sensory response showed slightly prolonged peak latency 4.4, which was also noted at left ulnar  sensory response, with low normal range snap amplitude.  Left median and ulnar motor response all demonstrate mildly prolonged distal latency, well-preserved CMAP amplitude, within normal range conduction velocity.  Selected EMG of left upper extremity muscles including left first dorsal interossei,  abductor pollicis brevis, biceps, triceps, deltoid was normal  Patient denies left hand sensory changes, continued complaints bilateral toes in the fingertips paresthesia this happened following her chemotherapy for breast cancer few years back, she denies bowel or bladder incontinence     PERTINENT IMAGING  MR BRAIN W WO CONTRAST 08/06/2021 MRI brain (with and without) demonstrating: -Chronic right posterior frontal cortical ischemic infarction along the primary motor strip. -Right internal carotid artery flow void signal abnormality may be related to proximal stenosis or occlusion. -Mild chronic small vessel ischemic disease. -No acute findings.  MR CERVICAL SPINE 08/06/2021 IMPRESSION:  MRI cervical spine (with and without) demonstrating: - At C5-6 uncovertebral joint hypertrophy at moderate bilateral foraminal stenosis. - At C3-4 disc bulging and uncovertebral joint hypertrophy with mild bilateral foraminal stenosis.  VAS CAROTID DUPLEX 08/20/2021 Summary:  Right Carotid: Velocities in the right ICA are consistent with a 80-99% stenosis. The ECA appears >50% stenosed.  Left Carotid: Velocities in the left ICA are consistent with a 80-99% stenosis. The ECA appears <50% stenosed.  Vertebrals:  Left vertebral artery demonstrates antegrade flow. Right vertebral artery demonstrates an occlusion.  Subclavians: Normal flow hemodynamics were seen in bilateral subclavian arteries.    TTE 08/20/2021 IMPRESSIONS  1. Left ventricular ejection fraction, by estimation, is 55 to 60%. The  left ventricle has normal function. The left ventricle has no regional  wall motion abnormalities. Left ventricular diastolic parameters were  normal.   2. Right ventricular systolic function is normal. The right ventricular  size is normal.   3. The mitral valve is normal in structure. No evidence of mitral valve  regurgitation.   4. The aortic valve is tricuspid. Aortic valve regurgitation is not   visualized. Aortic valve sclerosis is present, with no evidence of aortic  valve stenosis.        ROS:   14 system review of systems performed and negative with exception of those listed in HPI  PMH:  Past Medical History:  Diagnosis Date   Anxiety    BP (high blood pressure)    Depression    High cholesterol    Neuropathy     PSH:  Past Surgical History:  Procedure Laterality Date   breast cancer Right 2016   PORTACATH PLACEMENT  2016    Social History:  Social History   Socioeconomic History   Marital status: Widowed    Spouse name: Not on file   Number of children: Not on file   Years of education: Not on file   Highest education level: Not on file  Occupational History   Not on file  Tobacco Use   Smoking status: Every Day    Packs/day: 1.00    Types: Cigarettes   Smokeless tobacco: Not on file  Substance and Sexual Activity   Alcohol use: Yes   Drug use: Yes    Types: Marijuana   Sexual activity: Not on file  Other Topics Concern   Not on file  Social History Narrative   Not on file   Social Determinants of Health   Financial Resource Strain: Not on file  Food Insecurity: Not on file  Transportation Needs: Not on file  Physical Activity: Not on file  Stress: Not on file  Social Connections: Not on file  Intimate Partner Violence: Not on file    Family History:  Family History  Adopted: Yes  Family history unknown: Yes    Medications:   Current Outpatient Medications on File Prior to Visit  Medication Sig Dispense Refill   ALPRAZolam (XANAX) 0.25 MG tablet Take 0.25 mg by mouth 2 (two) times daily as needed.     anastrozole (ARIMIDEX) 1 MG tablet Take 1 tablet (1 mg total) by mouth daily. 30 tablet 3   aspirin EC 81 MG tablet Take 81 mg by mouth daily. Swallow whole.     fluticasone (FLONASE) 50 MCG/ACT nasal spray fluticasone propionate 50 mcg/actuation nasal spray,suspension  USE ONE SPRAY IN THE AFFECTED NOSTRIL TWICE A DAY      lisinopril-hydrochlorothiazide (ZESTORETIC) 10-12.5 MG tablet lisinopril 10 mg-hydrochlorothiazide 12.5 mg tablet  TAKE ONE TABLET BY MOUTH ONE TIME DAILY     pregabalin (LYRICA) 50 MG capsule pregabalin 50 mg capsule  TAKE ONE CAPSULE BY MOUTH TWICE A DAY     No current facility-administered medications on file prior to visit.    Allergies:   Allergies  Allergen Reactions   Aleve [Naproxen] Rash   Latex Rash      OBJECTIVE:  Physical Exam  Vitals:   09/08/21 1555  BP: (!) 109/58  Pulse: 75  Weight: 100 lb (45.4 kg)  Height: '5\' 1"'$  (1.549 m)   Body mass index is 18.89 kg/m. No results found.  General: Frail pleasantly anxious elderly Caucasian female, strong smoke odor, seated, in no evident distress Head: head normocephalic and atraumatic.   Neck: supple with no carotid or supraclavicular bruits Cardiovascular: regular rate and rhythm, no murmurs Musculoskeletal: no deformity Skin:  no rash/petichiae Vascular:  Normal pulses all extremities   Neurologic Exam Mental Status: Awake and fully alert. mild dysarthria although poor denture. Oriented to place and time. Recent and remote memory intact. Attention span, concentration and fund of knowledge appropriate. Mood and affect appropriate.  Cranial Nerves: Pupils equal, briskly reactive to light. Extraocular movements full without nystagmus. Visual fields full to confrontation. Hearing intact. Facial sensation intact. Face, tongue, palate moves normally and symmetrically.  Motor: Normal strength, bulk and tone right upper and lower extremity LUE: 4/5 proximal, 3/4 hand drip, decreased ROM throughout, decreased hand movement with spasticity spasticity throughout  LLE: 4+/5 HF otherwise 5/5 Sensory.: intact to touch , pinprick , position and vibratory sensation.  Coordination: Rapid alternating movements normal on right side. Finger-to-nose and heel-to-shin performed accurately right side and noted Left sided dysmetria  Gait  and Station: Arises from chair without difficulty. Stance is normal. Gait demonstrates decreased step height and stride length LLE with mild unsteadiness. No AD used. Tandem walk and heel toe not attempted.  Reflexes: 3+ LUE otherwise 1+. Toes downgoing.         ASSESSMENT: Mylah Baynes is a 71 y.o. year old female chronic progressive left arm weakness since 2019 with MR brain 07/2021 showing chronic R frontal lobe stroke. Further stroke work up showed b/l ICA 80 to 99% stenosis and R VA occlusion.  Vascular risk factors include HTN, HLD, tobacco use, and carotid stenosis.      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  Referral placed to PT/OT - did discuss unknown benefit as symptoms persistent/progressive since 2019. May consider f/u with Dr. Krista Blue to discuss Botox for spasticity if symptoms persist after therapy sessions.   Continue aspirin 81 mg daily  and switch pravastatin to atorvastatin 80 mg daily for secondary stroke prevention and in setting of bilateral carotid stenosis.   Will check lipid panel and A1c today Discussed secondary stroke prevention measures and importance of close PCP follow up for aggressive stroke risk factor management. I have gone over the pathophysiology of stroke, warning signs and symptoms, risk factors and their management in some detail with instructions to go to the closest emergency room for symptoms of concern. B/l carotid stenosis: provided office number for Dr. Trula Slade to reschedule initial consult visit.  Discussed urgency of this visit due to severe bilateral narrowing HTN: BP goal 130-150 to ensure adequate perfusion.  On low side today -discussed importance of routine monitoring at home and follow-up with PCP if BP remains on low side for further medication adjustment HLD: LDL goal <70. Switch pravastatin to atorvastatin. Check cholesterol levels today Tobacco use: discussed importance of complete tobacco  cessation as continued use greatly increases risk of additional strokes and potential complete occlusion of carotid arteries. She was advised to further discuss with PCP for further assistance in quitting    Follow up in 4 months or call earlier if needed   CC:  [place] provider: Dr. Krista Blue PCP: Emelia Loron, NP    I spent 38 minutes of face-to-face and non-face-to-face time with patient and friend.  This included previsit chart review, lab review, study review, order entry, electronic health record documentation, patient and friend education and discussion regarding further work-up showing old stroke possibly because of left-sided symptoms, further treatment plan as advised above, secondary stroke prevention measures and importance of managing stroke risk factors and answered all other questions to patient and friends satisfaction   Frann Rider, AGNP-BC  [facility] [address] [place], [place] [address]  Phone [phone] Fax [phone] Note: This document was prepared with digital dictation and possible smart phrase technology. Any transcriptional errors that result from this process are unintentional.

## 2021-09-08 NOTE — Patient Instructions (Addendum)
Please call to reschedule initial visit with Dr. Trula Slade - office number [phone] ? ?Referral placed to occupational rehab for hopeful improvement of left hand symptoms  ? ?Continue aspirin 81 mg daily  and stop pravastatin to atorvastatin '80mg'$  daily for secondary stroke prevention ? ?Continue to increase water  intake to at least 64 oz of water per day  ? ?Very important to stop smoking as continue tobacco use greatly increasing your risk of additional strokes and worsening narrowing in your carotid arteries  ? ?Continue to follow up with PCP regarding cholesterol and blood pressure management  ?Maintain strict control of hypertension with blood pressure between 130-150 and cholesterol with LDL cholesterol (bad cholesterol) goal below 70 mg/dL.  ? ?Signs of a Stroke? Follow the BEFAST method:  ?Balance Watch for a sudden loss of balance, trouble with coordination or vertigo ?Eyes Is there a sudden loss of vision in one or both eyes? Or double vision?  ?Face: Ask the person to smile. Does one side of the face droop or is it numb?  ?Arms: Ask the person to raise both arms. Does one arm drift downward? Is there weakness or numbness of a leg? ?Speech: Ask the person to repeat a simple phrase. Does the speech sound slurred/strange? Is the person confused ? ?Time: If you observe any of these signs, call 911. ? ? ? ? ?Followup in the future with me in 4 months or call earlier if needed ? ? ? ? ? ?Thank you for coming to see [place] at [facility]. I hope we have been able to provide you high quality care today. ? ?You may receive a patient satisfaction survey over the next few weeks. We would appreciate your feedback and comments so that we may continue to improve ourselves and the health of our patients. ? ?

## 2021-09-09 ENCOUNTER — Telehealth: Payer: Self-pay | Admitting: *Deleted

## 2021-09-09 LAB — LIPID PANEL
Chol/HDL Ratio: 3.4 ratio (ref 0.0–4.4)
Cholesterol, Total: 190 mg/dL (ref 100–199)
HDL: 56 mg/dL (ref >39)
LDL Chol Calc (NIH): 119 mg/dL — ABNORMAL HIGH (ref 0–99)
Triglycerides: 84 mg/dL (ref 0–149)
VLDL Cholesterol Cal: 15 mg/dL (ref 5–40)

## 2021-09-09 LAB — HEMOGLOBIN A1C
Est. average glucose Bld gHb Est-mCnc: 114 mg/dL
Hgb A1c MFr Bld: 5.6 % (ref 4.8–5.6)

## 2021-09-09 NOTE — Telephone Encounter (Signed)
LVM informing patient Please that recent cholesterol levels showed LDL or bad cholesterol at 119 with goal of less than 70. I advised she be sure to start atorvastatin 80 mg daily as Janett Billow advised at [place] visit. Advised she request f/u with PCP in the next 2-3 months for repeat cholesterol level check. Her A1c looked good and no evidence of diabetes. Left # for questions. ?

## 2021-09-10 NOTE — Progress Notes (Signed)
Chart reviewed, agree above plan ?

## 2021-09-11 ENCOUNTER — Ambulatory Visit: Payer: Commercial Managed Care - HMO | Admitting: Adult Health

## 2021-09-17 NOTE — Telephone Encounter (Signed)
Patient called in today stating they have been unable to contact Vascular surgery to schedule an appt. That she has called multiple times here and every number we have given does not work. Dr. Krista Blue had put in her notes to Dr. Trula Slade so I reached out to his office to see if they would reach out to patient directly to schedule-she let me know that referral was actually sent to [facility]. Referral has been sitting in that queue since 08/20/21 and no one has reached out to schedule. I wanted to check and see where this was meant to be sent? I will switch referral over to Dr. Stephens Shire office and they stated they'll review and call to schedule once received. Please advise how you'd like me to proceed thanks ?

## 2021-09-24 NOTE — Telephone Encounter (Signed)
Noted, thank you! It was sent via epic.  ?

## 2021-09-24 NOTE — Telephone Encounter (Signed)
Wanda Harrington the patient caregiver left a voicemail on my phone stating that the referral was sent to [facility], then it was sent to [facility] but now they want it sent back to [facility] for Dr. Antony Odea. She stated she spoke with the [facility] office and they informed her to let us know that we need to put a new referral in for Dr. Antony Odea in the [place] office.  ?

## 2021-09-24 NOTE — Addendum Note (Signed)
Addended by: Verlin Grills on: 09/24/2021 07:55 AM ? ? Modules accepted: Orders ? ?

## 2021-09-24 NOTE — Telephone Encounter (Signed)
I have placed referral

## 2021-09-25 ENCOUNTER — Inpatient Hospital Stay: Payer: Medicare Other | Admitting: Hematology and Oncology

## 2021-09-29 ENCOUNTER — Encounter: Payer: Self-pay | Admitting: Surgery

## 2021-09-29 ENCOUNTER — Ambulatory Visit (INDEPENDENT_AMBULATORY_CARE_PROVIDER_SITE_OTHER): Payer: Medicare Other | Admitting: Surgery

## 2021-09-29 VITALS — BP 108/58 | HR 74 | Temp 97.7°F | Resp 20 | Ht 61.0 in | Wt 98.6 lb

## 2021-09-29 DIAGNOSIS — I6523 Occlusion and stenosis of bilateral carotid arteries: Secondary | ICD-10-CM | POA: Diagnosis not present

## 2021-09-29 DIAGNOSIS — I70213 Atherosclerosis of native arteries of extremities with intermittent claudication, bilateral legs: Secondary | ICD-10-CM | POA: Diagnosis not present

## 2021-09-29 NOTE — Progress Notes (Signed)
? ?[facility] ? ?Patient name: Wanda Harrington MRN: [id number] DOB: August 01, 1950 Sex: female ? ? ?REQUESTING PROVIDER:  ? ? Dr. Krista Blue ? ? ? ?REASON FOR CONSULT:  ?  ?Carotid stenosis ? ?HISTORY OF PRESENT ILLNESS:  ? ?Wanda Harrington is a 71 y.o. female, who is referred for evaluation of bilateral carotid stenosis.  In June 2021 she developed left hand weakness.  Over time, this progressed to where she cannot raise her arm over her head.  She has had difficulty with use of her left hand.  She has overall generalized weakness especially in the left leg.  She was seen by neurology in [place].  At that time it was reported that she had left arm focal neuropathy.  She was referred to orthopedic surgery for potential decompression.  In February 2023 she had an MRI that showed a right frontal posterior cortical ischemic infarct.  Carotid duplex showed bilateral carotid stenosis.  She had a TEE that was normal.   ? 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 ? ?The patient is medically managed for hypertension.  She is on a statin for hypercholesterolemia.  She continues to smoke.  She also complains of leg pain with walking.  She does not have any open wounds. ? ?PAST MEDICAL HISTORY  ? ? ?Past Medical History:  ?Diagnosis Date  ? Anxiety   ? BP (high blood pressure)   ? Depression   ? High cholesterol   ? Neuropathy   ? ? ? ?FAMILY HISTORY  ? ?Family History  ?Adopted: Yes  ?Family history unknown: Yes  ? ? ?SOCIAL HISTORY:  ? ?Social History  ? ?Socioeconomic History  ? Marital status: Widowed  ?  Spouse name: Not on file  ? Number of children: Not on file  ? Years of education: Not on file  ? Highest education level: Not on file  ?Occupational History  ? Not on file  ?Tobacco Use  ? Smoking status: Every Day  ?  Packs/day: 1.00  ?  Types: Cigarettes  ? Smokeless tobacco: Not on file  ?Vaping Use  ? Vaping  Use: Never used  ?Substance and Sexual Activity  ? Alcohol use: Yes  ? Drug use: Yes  ?  Types: Marijuana  ? Sexual activity: Not on file  ?Other Topics Concern  ? Not on file  ?Social History Narrative  ? Not on file  ? ?Social Determinants of Health  ? ?Financial Resource Strain: Not on file  ?Food Insecurity: Not on file  ?Transportation Needs: Not on file  ?Physical Activity: Not on file  ?Stress: Not on file  ?Social Connections: Not on file  ?Intimate Partner Violence: Not on file  ? ? ?ALLERGIES:  ? ? ?Allergies  ?Allergen Reactions  ? Aleve [Naproxen] Rash  ? Latex Rash  ? ? ?CURRENT MEDICATIONS:  ? ? ?Current Outpatient Medications  ?Medication Sig Dispense Refill  ? ALPRAZolam (XANAX) 0.25 MG tablet Take 0.25 mg by mouth 2 (two) times daily as needed.    ? anastrozole (ARIMIDEX) 1 MG tablet Take 1 tablet (1 mg total) by mouth daily. 30 tablet 3  ? aspirin EC 81 MG tablet Take 81 mg by mouth daily. Swallow whole.    ? atorvastatin (LIPITOR) 80 MG tablet Take 1 tablet (80 mg total) by mouth daily. 90 tablet 3  ? fluticasone (FLONASE) 50 MCG/ACT nasal spray fluticasone propionate 50 mcg/actuation nasal spray,suspension ? USE ONE SPRAY IN  THE AFFECTED NOSTRIL TWICE A DAY    ? lisinopril-hydrochlorothiazide (ZESTORETIC) 10-12.5 MG tablet lisinopril 10 mg-hydrochlorothiazide 12.5 mg tablet ? TAKE ONE TABLET BY MOUTH ONE TIME DAILY    ? pregabalin (LYRICA) 50 MG capsule pregabalin 50 mg capsule ? TAKE ONE CAPSULE BY MOUTH TWICE A DAY    ? ?No current facility-administered medications for this visit.  ? ? ?REVIEW OF SYSTEMS:  ? ?'[X]'$  denotes positive finding, '[ ]'$  denotes negative finding ?Cardiac  Comments:  ?Chest pain or chest pressure:    ?Shortness of breath upon exertion:    ?Short of breath when lying flat:    ?Irregular heart rhythm:    ?    ?Vascular    ?Pain in calf, thigh, or hip brought on by ambulation: x   ?Pain in feet at night that wakes you up from your sleep:     ?Blood clot in your veins:    ?Leg  swelling:     ?    ?Pulmonary    ?Oxygen at home:    ?Productive cough:     ?Wheezing:     ?    ?Neurologic    ?Sudden weakness in arms or legs:     ?Sudden numbness in arms or legs:     ?Sudden onset of difficulty speaking or slurred speech:    ?Temporary loss of vision in one eye:     ?Problems with dizziness:     ?    ?Gastrointestinal    ?Blood in stool:     ? ?Vomited blood:     ?    ?Genitourinary    ?Burning when urinating:     ?Blood in urine:    ?    ?Psychiatric    ?Major depression:     ?    ?Hematologic    ?Bleeding problems:    ?Problems with blood clotting too easily:    ?    ?Skin    ?Rashes or ulcers:    ?    ?Constitutional    ?Fever or chills:    ? ?PHYSICAL EXAM:  ? ?Vitals:  ? 09/29/21 1427  ?BP: (!) 108/58  ?Pulse: 74  ?Resp: 20  ?Temp: 97.7 ?F (36.5 ?C)  ?SpO2: (!) 87%  ?Weight: 98 lb 9.6 oz (44.7 kg)  ?Height: '5\' 1"'$  (1.549 m)  ? ? ?GENERAL: The patient is a well-nourished female, in no acute distress. The vital signs are documented above. ?CARDIAC: There is a regular rate and rhythm.  ?VASCULAR: Nonpalpable pedal pulses ?PULMONARY: Nonlabored respirations ?MUSCULOSKELETAL: There are no major deformities or cyanosis. ?NEUROLOGIC: No focal weakness or paresthesias are detected. ?SKIN: There are no ulcers or rashes noted. ?PSYCHIATRIC: The patient has a normal affect. ? ?STUDIES:  ? ?I have reviewed her ultrasound with the following findings: ?Right Carotid: Velocities in the right ICA are consistent with a 80-99%  ?               stenosis. The ECA appears >50% stenosed.  ? ?Left Carotid: Velocities in the left ICA are consistent with a 80-99%  ?stenosis.  ?              The ECA appears <50% stenosed.  ? ?Vertebrals:  Left vertebral artery demonstrates antegrade flow. Right  ?vertebral  ?             artery demonstrates an occlusion.  ?Subclavians: Normal flow hemodynamics were seen in bilateral subclavian  ?  arteries.  ? ?ASSESSMENT and PLAN  ? ?Carotid: The patient has a old right  brain stroke with left-sided symptoms.  Ultrasound shows bilateral greater than 80% lesions.  I am sending her for a CT angiogram of the neck to define her anatomy to see if she is a candidate for TCAR.  She likely has severe underlying COPD given her smoking history.  I think she would be better served with a less invasive procedure ? ?Claudication: The patient complains of leg pain with minimal activity.  She does not have palpable pedal pulses.  I will get arterial evaluation of her legs once her carotid issues are resolved. ? ?It is paramount that the patient stop smoking ? ? ?Annamarie Major, IV, MD, FACS ?[facility] ?Tel [phone] ?Pager [phone]  ?

## 2021-10-01 ENCOUNTER — Other Ambulatory Visit: Payer: Self-pay

## 2021-10-01 DIAGNOSIS — I6523 Occlusion and stenosis of bilateral carotid arteries: Secondary | ICD-10-CM

## 2021-10-01 DIAGNOSIS — I63311 Cerebral infarction due to thrombosis of right middle cerebral artery: Secondary | ICD-10-CM

## 2021-10-02 ENCOUNTER — Inpatient Hospital Stay: Payer: Medicare Other | Admitting: Hematology and Oncology

## 2021-10-02 ENCOUNTER — Telehealth: Payer: Self-pay | Admitting: Hematology and Oncology

## 2021-10-02 ENCOUNTER — Inpatient Hospital Stay: Admission: RE | Admit: 2021-10-02 | Payer: Medicare Other | Source: Ambulatory Visit

## 2021-10-02 NOTE — Telephone Encounter (Signed)
.  Called patient to schedule appointment per 4/5 inbasket, patient is aware of date and time.   ?

## 2021-10-07 ENCOUNTER — Telehealth: Payer: Self-pay | Admitting: Hematology and Oncology

## 2021-10-07 ENCOUNTER — Inpatient Hospital Stay: Payer: Medicare Other | Admitting: Hematology and Oncology

## 2021-10-07 NOTE — Telephone Encounter (Signed)
Patient called to reschedule today's appointment due to burst water line. ?

## 2021-10-20 ENCOUNTER — Ambulatory Visit: Payer: Medicare Other | Admitting: Surgery

## 2021-10-22 ENCOUNTER — Encounter: Payer: Self-pay | Admitting: Hematology and Oncology

## 2021-10-22 ENCOUNTER — Other Ambulatory Visit: Payer: Self-pay

## 2021-10-22 ENCOUNTER — Inpatient Hospital Stay: Payer: Medicare Other | Attending: Hematology and Oncology | Admitting: Hematology and Oncology

## 2021-10-22 DIAGNOSIS — Z923 Personal history of irradiation: Secondary | ICD-10-CM | POA: Diagnosis not present

## 2021-10-22 DIAGNOSIS — Z9221 Personal history of antineoplastic chemotherapy: Secondary | ICD-10-CM | POA: Insufficient documentation

## 2021-10-22 DIAGNOSIS — F17210 Nicotine dependence, cigarettes, uncomplicated: Secondary | ICD-10-CM | POA: Insufficient documentation

## 2021-10-22 DIAGNOSIS — Z9011 Acquired absence of right breast and nipple: Secondary | ICD-10-CM | POA: Insufficient documentation

## 2021-10-22 DIAGNOSIS — Z170 Estrogen receptor positive status [ER+]: Secondary | ICD-10-CM | POA: Diagnosis not present

## 2021-10-22 DIAGNOSIS — Z79811 Long term (current) use of aromatase inhibitors: Secondary | ICD-10-CM | POA: Diagnosis not present

## 2021-10-22 DIAGNOSIS — R531 Weakness: Secondary | ICD-10-CM | POA: Diagnosis not present

## 2021-10-22 DIAGNOSIS — C50111 Malignant neoplasm of central portion of right female breast: Secondary | ICD-10-CM | POA: Diagnosis present

## 2021-10-22 NOTE — Assessment & Plan Note (Addendum)
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  This was diagnosed back in October 2015.   ?She underwent neoadjuvant chemotherapy with dose dense Adriamycin and Cytoxan followed by weekly Taxol, completed 9 weeks of Taxol and discontinued because of neuropathy.   ?She underwent right modified radical mastectomy in April 2016 which revealed 2.3 cm residual tumor with 3 out of 11 positive lymph nodes.  Final pathologic staging pT2 pN1 M0, stage IIb.  She received adjuvant postmastectomy radiation which completed in July 2016.  She was prescribed adjuvant Arimidex in June 2016.  ? ? She reported some left upper extremity weakness back in June 2021 and had an MRI brain and MRI cervical spine which showed no definitive evidence of metastatic disease according to the report.  She was recommended to have follow-up MRIs but she could not do these, she had to move out of [place] because of lack of place to live.  ? ?She had a repeat MRI brain and cervical spine recently which did not show any evidence of distant metastatic disease.  I have encouraged her to continue anastrozole at this time.  She also needs to continue mammograms of the left breast.  She once once again encouraged to schedule a mammogram and bone density as soon as possible.  Since she had an interrupted time when she did not take anastrozole for almost a year, I think she will benefit from at least 7 years of antiestrogen therapy.  We will plan to continue it until at least May 2024 and reevaluate. ? ?She was instructed to return to clinic in 3 months. ?

## 2021-10-22 NOTE — Progress Notes (Signed)
[facility] ?CONSULT NOTE ? ?Patient Care Team: ?Emelia Loron, NP as PCP - General (Nurse Practitioner) ?Iran Planas, MD as Consulting Physician (Orthopedic Surgery) ? ?CHIEF COMPLAINTS/PURPOSE OF CONSULTATION:  ?Newly diagnosed breast cancer ? ?HISTORY OF PRESENTING ILLNESS:  ?Wanda Harrington 71 y.o. female is here because of recent diagnosis of right breast IDC ? ?SUMMARY OF ONCOLOGIC HISTORY: ?Oncology History  ?Malignant neoplasm of central portion of right breast, estrogen receptor positive ([place])  ?10/08/2014 Surgery  ? T2 N1 M0 right breast IDC, stage IIb ER diagnosis, grade 2, ER 96%, PR 10%, KI of 20% negative for HER2 by IHC and FISH diagnosed in April 05, 2014 treated with neoadjuvant chemotherapy with dose dense Adriamycin and Cytoxan followed by weekly Taxol 05/03/2014 to August 30, 2014, received 9 weeks of Taxol because of neuropathy, status post right modified radical mastectomy on October 08, 2014 which revealed a 2.3 cm residual tumor with 3 out of 11 positive lymph nodes, adjuvant postmastectomy radiation completed on December 28, 2014 and adjuvant Arimidex prescribed on December 27, 2014. ?  ?07/28/2021 Initial Diagnosis  ? Malignant neoplasm of central portion of right breast, estrogen receptor positive ([place]) ? ?  ?07/28/2021 Cancer Staging  ? Staging form: Breast, AJCC 8th Edition ?- Clinical stage from 07/28/2021: Stage IIA (cT2, cN1, cM0, G2, ER+, PR+, HER2-) - Signed by Benay Pike, MD on 07/28/2021 ?Histologic grading system: 3 grade system ? ?  ?08/06/2021 Imaging  ? MR brain and MR cervical spine for upper extremity weakness with no evidence of metastatic disease. 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  ? ?She is currently back on adjuvant anastrozole.  She is taking it as prescribed.  She is going to work with physical therapy regarding her upper extremity weakness.  She is here to schedule her mammogram and bone density.  She denies  any other complaints today except for some ongoing vascular evaluation. ?Rest of the pertinent review of systems reviewed and negative ? ?MEDICAL HISTORY:  ?Past Medical History:  ?Diagnosis Date  ? Anxiety   ? BP (high blood pressure)   ? Depression   ? High cholesterol   ? Neuropathy   ? ? ?SURGICAL HISTORY: ?Past Surgical History:  ?Procedure Laterality Date  ? breast cancer Right 2016  ? PORTACATH PLACEMENT  2016  ? ? ?SOCIAL HISTORY: ?Social History  ? ?Socioeconomic History  ? Marital status: Widowed  ?  Spouse name: Not on file  ? Number of children: Not on file  ? Years of education: Not on file  ? Highest education level: Not on file  ?Occupational History  ? Not on file  ?Tobacco Use  ? Smoking status: Every Day  ?  Packs/day: 1.00  ?  Types: Cigarettes  ? Smokeless tobacco: Not on file  ?Vaping Use  ? Vaping Use: Never used  ?Substance and Sexual Activity  ? Alcohol use: Yes  ? Drug use: Yes  ?  Types: Marijuana  ? Sexual activity: Not on file  ?Other Topics Concern  ? Not on file  ?Social History Narrative  ? Not on file  ? ?Social Determinants of Health  ? ?Financial Resource Strain: Not on file  ?Food Insecurity: Not on file  ?Transportation Needs: Not on file  ?Physical Activity: Not on file  ?Stress: Not on file  ?Social Connections: Not on file  ?Intimate Partner Violence: Not on file  ? ? ?FAMILY HISTORY: ?Family History  ?Adopted: Yes  ?Family  history unknown: Yes  ? ? ?ALLERGIES:  is allergic to aleve [naproxen] and latex. ? ?MEDICATIONS:  ?Current Outpatient Medications  ?Medication Sig Dispense Refill  ? ALPRAZolam (XANAX) 0.25 MG tablet Take 0.25 mg by mouth 2 (two) times daily as needed.    ? anastrozole (ARIMIDEX) 1 MG tablet Take 1 tablet (1 mg total) by mouth daily. 30 tablet 3  ? aspirin EC 81 MG tablet Take 81 mg by mouth daily. Swallow whole.    ? atorvastatin (LIPITOR) 80 MG tablet Take 1 tablet (80 mg total) by mouth daily. 90 tablet 3  ? fluticasone (FLONASE) 50 MCG/ACT nasal spray  fluticasone propionate 50 mcg/actuation nasal spray,suspension ? USE ONE SPRAY IN THE AFFECTED NOSTRIL TWICE A DAY    ? lisinopril-hydrochlorothiazide (ZESTORETIC) 10-12.5 MG tablet lisinopril 10 mg-hydrochlorothiazide 12.5 mg tablet ? TAKE ONE TABLET BY MOUTH ONE TIME DAILY    ? pregabalin (LYRICA) 50 MG capsule pregabalin 50 mg capsule ? TAKE ONE CAPSULE BY MOUTH TWICE A DAY    ? ?No current facility-administered medications for this visit.  ? ? ?REVIEW OF SYSTEMS:   ?Constitutional: Denies fevers, chills or abnormal night sweats ?Eyes: Denies blurriness of vision, double vision or watery eyes ?Ears, nose, mouth, throat, and face: Denies mucositis or sore throat ?Respiratory: Denies cough, dyspnea or wheezes ?Cardiovascular: Denies palpitation, chest discomfort or lower extremity swelling ?Gastrointestinal:  Denies nausea, heartburn or change in bowel habits ?Skin: Denies abnormal skin rashes ?Lymphatics: Denies new lymphadenopathy or easy bruising ?Neurological:Denies numbness, tingling or new weaknesses ?Behavioral/Psych: Mood is stable, no new changes  ?Breast: Denies any palpable lumps or discharge ?All other systems were reviewed with the patient and are negative. ? ?PHYSICAL EXAMINATION: ?ECOG PERFORMANCE STATUS: 0 - Asymptomatic ? ?Vitals:  ? 10/22/21 1529  ?BP: 133/84  ?Pulse: 83  ?Resp: 18  ?Temp: 97.8 ?F (36.6 ?C)  ?SpO2: 96%  ? ? ?Filed Weights  ? 10/22/21 1529  ?Weight: 97 lb 14.4 oz (44.4 kg)  ? ? ?Physical exam deferred today in lieu of counseling.  We did a physical exam during her last visit few weeks ago.   ? ? ?LABORATORY DATA:  ?I have reviewed the data as listed ?Lab Results  ?Component Value Date  ? WBC 6.7 08/04/2021  ? HGB 14.0 08/04/2021  ? HCT 42.7 08/04/2021  ? MCV 92.0 08/04/2021  ? PLT 202 08/04/2021  ? ?Lab Results  ?Component Value Date  ? NA 138 08/04/2021  ? K 4.7 08/04/2021  ? CL 103 08/04/2021  ? CO2 28 08/04/2021  ? ? ?RADIOGRAPHIC STUDIES: ?I have personally reviewed the  radiological reports and agreed with the findings in the report. ? 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  This was diagnosed back in October 2015.   ?She underwent neoadjuvant chemotherapy with dose dense Adriamycin and Cytoxan followed by weekly Taxol, completed 9 weeks of Taxol and discontinued because of neuropathy.   ?She underwent right modified radical mastectomy in April 2016 which revealed 2.3 cm residual tumor with 3 out of 11 positive lymph nodes.  Final pathologic staging pT2 pN1 M0, stage IIb.  She received adjuvant  postmastectomy radiation which completed in July 2016.  She was prescribed adjuvant Arimidex in June 2016.  ? ? She reported some left upper extremity weakness back in June 2021 and had an MRI brain and MRI cervical spine which showed no definitive evidence of metastatic disease according to the report.  She was recommended to have follow-up MRIs but she could not do these, she had to move out of [place] because of lack of place to live.  ? ?She had a repeat MRI brain and cervical spine recently which did not show any evidence of distant metastatic disease.  I have encouraged her to continue anastrozole at this time.  She also needs to continue mammograms of the left breast.  She once once again encouraged to schedule a mammogram and bone density as soon as possible.  Since she had an interrupted time when she did not take anastrozole for almost a year, I think she will benefit from at least 7 years of antiestrogen therapy.  We will plan to continue it until at least May 2024 and  reevaluate. ? ?She was instructed to return to clinic in 3 months. ? ? ?All questions were answered. The patient knows to call the clinic with any problems, questions or concerns. ?I spent 60 minutes in the care of thi

## 2021-10-27 ENCOUNTER — Ambulatory Visit
Admission: RE | Admit: 2021-10-27 | Discharge: 2021-10-27 | Disposition: A | Discharge status: Home / Self Care | Payer: Medicare Other | Source: Ambulatory Visit | Attending: Surgery | Admitting: Surgery

## 2021-10-27 ENCOUNTER — Other Ambulatory Visit: Payer: Self-pay

## 2021-10-27 DIAGNOSIS — I63311 Cerebral infarction due to thrombosis of right middle cerebral artery: Secondary | ICD-10-CM

## 2021-10-27 DIAGNOSIS — I6523 Occlusion and stenosis of bilateral carotid arteries: Secondary | ICD-10-CM

## 2021-10-27 MED ORDER — IOPAMIDOL (ISOVUE-370) INJECTION 76%: 75.0000 mL | Freq: Once | INTRAVENOUS | Status: DC | PRN

## 2021-10-31 ENCOUNTER — Ambulatory Visit (HOSPITAL_COMMUNITY)
Admission: RE | Admit: 2021-10-31 | Discharge: 2021-10-31 | Disposition: A | Discharge status: Home / Self Care | Payer: Medicare Other | Source: Ambulatory Visit | Attending: Surgery | Admitting: Surgery

## 2021-10-31 DIAGNOSIS — I6523 Occlusion and stenosis of bilateral carotid arteries: Secondary | ICD-10-CM | POA: Diagnosis not present

## 2021-10-31 DIAGNOSIS — I63311 Cerebral infarction due to thrombosis of right middle cerebral artery: Secondary | ICD-10-CM

## 2021-10-31 MED ORDER — HEPARIN SOD (PORK) LOCK FLUSH 100 UNIT/ML IV SOLN
INTRAVENOUS | Status: AC
  Filled 2021-10-31: qty 5

## 2021-10-31 MED ORDER — IOHEXOL 350 MG/ML SOLN
50.0000 mL | Freq: Once | INTRAVENOUS | Status: AC | PRN
  Administered 2021-10-31: 50 mL via INTRAVENOUS

## 2021-11-03 ENCOUNTER — Ambulatory Visit (INDEPENDENT_AMBULATORY_CARE_PROVIDER_SITE_OTHER): Payer: Medicare Other | Admitting: Surgery

## 2021-11-03 ENCOUNTER — Encounter: Payer: Self-pay | Admitting: Surgery

## 2021-11-03 VITALS — BP 118/80 | HR 77 | Temp 97.7°F | Resp 20 | Ht 61.0 in | Wt 98.0 lb

## 2021-11-03 DIAGNOSIS — I6523 Occlusion and stenosis of bilateral carotid arteries: Secondary | ICD-10-CM | POA: Diagnosis not present

## 2021-11-03 MED ORDER — CLOPIDOGREL BISULFATE 75 MG PO TABS: 75.0000 mg | ORAL_TABLET | Freq: Every day | ORAL | 6 refills | Status: DC

## 2021-11-03 NOTE — H&P (View-Only) (Signed)
[facility]  Patient name: Wanda Harrington MRN: [id number] DOB: Jul 23, 1950 Sex: female   REASON FOR VISIT:    Follow up  HISOTRY OF PRESENT ILLNESS:    Wanda Harrington is a 71 y.o. female, who is referred for evaluation of bilateral carotid stenosis.  In June 2021 she developed left hand weakness.  Over time, this progressed to where she cannot raise her arm over her head.  She has had difficulty with use of her left hand.  She has overall generalized weakness especially in the left leg.  She was seen by neurology in [place].  At that time it was reported that she had left arm focal neuropathy.  She was referred to orthopedic surgery for potential decompression.  In February 2023 she had an MRI that showed a right frontal posterior cortical ischemic infarct.  Carotid duplex showed bilateral carotid stenosis.  She had a TEE that was normal.  I sent her for a CT scan to better define her anatomy.  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  The patient is medically managed for hypertension.  She is on a statin for hypercholesterolemia.  She continues to smoke.  She also complains of leg pain with walking.  She does not have any open wounds.   PAST MEDICAL HISTORY:   Past Medical History:  Diagnosis Date   Anxiety    BP (high blood pressure)    Depression    High cholesterol    Neuropathy      FAMILY HISTORY:   Family History  Adopted: Yes  Family history unknown: Yes    SOCIAL HISTORY:   Social History   Tobacco Use   Smoking status: Every Day    Packs/day: 1.00    Types: Cigarettes   Smokeless tobacco: Not on file  Substance Use Topics   Alcohol use: Yes     ALLERGIES:   Allergies  Allergen Reactions   Aleve [Naproxen] Rash   Latex Rash     CURRENT MEDICATIONS:   Current Outpatient Medications  Medication Sig Dispense Refill    ALPRAZolam (XANAX) 0.25 MG tablet Take 0.25 mg by mouth 2 (two) times daily as needed.     anastrozole (ARIMIDEX) 1 MG tablet Take 1 tablet (1 mg total) by mouth daily. 30 tablet 3   aspirin EC 81 MG tablet Take 81 mg by mouth daily. Swallow whole.     atorvastatin (LIPITOR) 80 MG tablet Take 1 tablet (80 mg total) by mouth daily. 90 tablet 3   fluticasone (FLONASE) 50 MCG/ACT nasal spray fluticasone propionate 50 mcg/actuation nasal spray,suspension  USE ONE SPRAY IN THE AFFECTED NOSTRIL TWICE A DAY     lisinopril-hydrochlorothiazide (ZESTORETIC) 10-12.5 MG tablet lisinopril 10 mg-hydrochlorothiazide 12.5 mg tablet  TAKE ONE TABLET BY MOUTH ONE TIME DAILY     pregabalin (LYRICA) 50 MG capsule pregabalin 50 mg capsule  TAKE ONE CAPSULE BY MOUTH TWICE A DAY     No current facility-administered medications for this visit.    REVIEW OF SYSTEMS:   '[X]'$  denotes positive finding, '[ ]'$  denotes negative finding Cardiac  Comments:  Chest pain or chest pressure:    Shortness of breath upon exertion:    Short of breath when lying flat:    Irregular heart rhythm:        Vascular    Pain in calf, thigh, or hip brought on by ambulation:    Pain in feet at night  that wakes you up from your sleep:     Blood clot in your veins:    Leg swelling:         Pulmonary    Oxygen at home:    Productive cough:     Wheezing:         Neurologic    Sudden weakness in arms or legs:     Sudden numbness in arms or legs:     Sudden onset of difficulty speaking or slurred speech:    Temporary loss of vision in one eye:     Problems with dizziness:         Gastrointestinal    Blood in stool:     Vomited blood:         Genitourinary    Burning when urinating:     Blood in urine:        Psychiatric    Major depression:         Hematologic    Bleeding problems:    Problems with blood clotting too easily:        Skin    Rashes or ulcers:        Constitutional    Fever or chills:      PHYSICAL  EXAM:   There were no vitals filed for this visit.  GENERAL: The patient is a well-nourished female, in no acute distress. The vital signs are documented above. CARDIAC: There is a regular rate and rhythm.  VASCULAR: Nonpalpable pedal pulses PULMONARY: Non-labored respirations MUSCULOSKELETAL: There are no major deformities or cyanosis. NEUROLOGIC: No focal weakness or paresthesias are detected. SKIN: There are no ulcers or rashes noted. PSYCHIATRIC: The patient has a normal affect.  STUDIES:    I have reviewed her CT scan with the following findings: 1. Positive for High-grade Right > Left ICA origin/bulb stenoses: - RADIOGRAPHIC STRING SIGN along an 11 mm segment of the Right with patent but only thread-like downstream Right ICA until better reconstitution in the visible cavernous segment. - High-grade Left ICA bulb stenosis approaching a radiographic string sign, but with normal downstream ICA caliber.   2. Occluded Right Vertebral Artery origin with diminutive reconstituted V3 and V4 segments. Right PICA remains patent.   3. Dominant appearing Left Vertebral Artery without stenosis.   4. Aortic Atherosclerosis (ICD10-I70.0) and Emphysema (ICD10-J43.9).   MEDICAL ISSUES:   Carotid: The patient has high-grade bilateral carotid stenosis.  Her stroke was in the right brain and so I discussed starting on the right side.  She will ultimately need bilateral repairs.  Because of her comorbidities, I think she is a better candidate for TCAR.  She does have a short run why but I think this is still doable.  I would like to get this done within the next 2 to 3 weeks.  I discussed the risk of stroke and bleeding with the patient as well as the expected postoperative course.  She is being given a prescription for Plavix which she will start today.  Patient has a Port-A-Cath in place which she would like to be used for blood draws.  She also suffers from postoperative nausea and vomiting and  wants to make sure she is adequately treated.    Leia Alf, MD, FACS [facility] [phone] Pager [phone]

## 2021-11-03 NOTE — Progress Notes (Signed)
? ?[facility] ? ?Patient name: Wanda Harrington MRN: [id number] DOB: 03/01/1951 Sex: female ? ? ?REASON FOR VISIT:  ? ? ?Follow up ? ?HISOTRY OF PRESENT ILLNESS:  ? ? ?Wanda Harrington is a 71 y.o. female, who is referred for evaluation of bilateral carotid stenosis.  In June 2021 she developed left hand weakness.  Over time, this progressed to where she cannot raise her arm over her head.  She has had difficulty with use of her left hand.  She has overall generalized weakness especially in the left leg.  She was seen by neurology in [place].  At that time it was reported that she had left arm focal neuropathy.  She was referred to orthopedic surgery for potential decompression.  In February 2023 she had an MRI that showed a right frontal posterior cortical ischemic infarct.  Carotid duplex showed bilateral carotid stenosis.  She had a TEE that was normal.  I sent her for a CT scan to better define her anatomy. ? 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 ? ?The patient is medically managed for hypertension.  She is on a statin for hypercholesterolemia.  She continues to smoke.  She also complains of leg pain with walking.  She does not have any open wounds. ? ? ?PAST MEDICAL HISTORY:  ? ?Past Medical History:  ?Diagnosis Date  ? Anxiety   ? BP (high blood pressure)   ? Depression   ? High cholesterol   ? Neuropathy   ? ? ? ?FAMILY HISTORY:  ? ?Family History  ?Adopted: Yes  ?Family history unknown: Yes  ? ? ?SOCIAL HISTORY:  ? ?Social History  ? ?Tobacco Use  ? Smoking status: Every Day  ?  Packs/day: 1.00  ?  Types: Cigarettes  ? Smokeless tobacco: Not on file  ?Substance Use Topics  ? Alcohol use: Yes  ? ? ? ?ALLERGIES:  ? ?Allergies  ?Allergen Reactions  ? Aleve [Naproxen] Rash  ? Latex Rash  ? ? ? ?CURRENT MEDICATIONS:  ? ?Current Outpatient Medications  ?Medication Sig Dispense Refill  ?  ALPRAZolam (XANAX) 0.25 MG tablet Take 0.25 mg by mouth 2 (two) times daily as needed.    ? anastrozole (ARIMIDEX) 1 MG tablet Take 1 tablet (1 mg total) by mouth daily. 30 tablet 3  ? aspirin EC 81 MG tablet Take 81 mg by mouth daily. Swallow whole.    ? atorvastatin (LIPITOR) 80 MG tablet Take 1 tablet (80 mg total) by mouth daily. 90 tablet 3  ? fluticasone (FLONASE) 50 MCG/ACT nasal spray fluticasone propionate 50 mcg/actuation nasal spray,suspension ? USE ONE SPRAY IN THE AFFECTED NOSTRIL TWICE A DAY    ? lisinopril-hydrochlorothiazide (ZESTORETIC) 10-12.5 MG tablet lisinopril 10 mg-hydrochlorothiazide 12.5 mg tablet ? TAKE ONE TABLET BY MOUTH ONE TIME DAILY    ? pregabalin (LYRICA) 50 MG capsule pregabalin 50 mg capsule ? TAKE ONE CAPSULE BY MOUTH TWICE A DAY    ? ?No current facility-administered medications for this visit.  ? ? ?REVIEW OF SYSTEMS:  ? ?'[X]'$  denotes positive finding, '[ ]'$  denotes negative finding ?Cardiac  Comments:  ?Chest pain or chest pressure:    ?Shortness of breath upon exertion:    ?Short of breath when lying flat:    ?Irregular heart rhythm:    ?    ?Vascular    ?Pain in calf, thigh, or hip brought on by ambulation:    ?Pain in feet at night  that wakes you up from your sleep:     ?Blood clot in your veins:    ?Leg swelling:     ?    ?Pulmonary    ?Oxygen at home:    ?Productive cough:     ?Wheezing:     ?    ?Neurologic    ?Sudden weakness in arms or legs:     ?Sudden numbness in arms or legs:     ?Sudden onset of difficulty speaking or slurred speech:    ?Temporary loss of vision in one eye:     ?Problems with dizziness:     ?    ?Gastrointestinal    ?Blood in stool:     ?Vomited blood:     ?    ?Genitourinary    ?Burning when urinating:     ?Blood in urine:    ?    ?Psychiatric    ?Major depression:     ?    ?Hematologic    ?Bleeding problems:    ?Problems with blood clotting too easily:    ?    ?Skin    ?Rashes or ulcers:    ?    ?Constitutional    ?Fever or chills:    ? ? ?PHYSICAL  EXAM:  ? ?There were no vitals filed for this visit. ? ?GENERAL: The patient is a well-nourished female, in no acute distress. The vital signs are documented above. ?CARDIAC: There is a regular rate and rhythm.  ?VASCULAR: Nonpalpable pedal pulses ?PULMONARY: Non-labored respirations ?MUSCULOSKELETAL: There are no major deformities or cyanosis. ?NEUROLOGIC: No focal weakness or paresthesias are detected. ?SKIN: There are no ulcers or rashes noted. ?PSYCHIATRIC: The patient has a normal affect. ? ?STUDIES:  ? ? ?I have reviewed her CT scan with the following findings: ?1. Positive for High-grade Right > Left ICA origin/bulb stenoses: ?- RADIOGRAPHIC STRING SIGN along an 11 mm segment of the Right with ?patent but only thread-like downstream Right ICA until better ?reconstitution in the visible cavernous segment. ?- High-grade Left ICA bulb stenosis approaching a radiographic ?string sign, but with normal downstream ICA caliber. ?  ?2. Occluded Right Vertebral Artery origin with diminutive ?reconstituted V3 and V4 segments. Right PICA remains patent. ?  ?3. Dominant appearing Left Vertebral Artery without stenosis. ?  ?4. Aortic Atherosclerosis (ICD10-I70.0) and Emphysema (ICD10-J43.9). ?  ?MEDICAL ISSUES:  ? ?Carotid: The patient has high-grade bilateral carotid stenosis.  Her stroke was in the right brain and so I discussed starting on the right side.  She will ultimately need bilateral repairs.  Because of her comorbidities, I think she is a better candidate for TCAR.  She does have a short run why but I think this is still doable.  I would like to get this done within the next 2 to 3 weeks.  I discussed the risk of stroke and bleeding with the patient as well as the expected postoperative course.  She is being given a prescription for Plavix which she will start today. ? ?Patient has a Port-A-Cath in place which she would like to be used for blood draws.  She also suffers from postoperative nausea and vomiting and  wants to make sure she is adequately treated. ? ? ? ?Annamarie Major, IV, MD, FACS ?[facility] [place] ?Tel [phone] ?Pager [phone]  ?

## 2021-11-06 ENCOUNTER — Other Ambulatory Visit: Payer: Self-pay

## 2021-11-06 DIAGNOSIS — I6523 Occlusion and stenosis of bilateral carotid arteries: Secondary | ICD-10-CM

## 2021-11-14 NOTE — Pre-Procedure Instructions (Signed)
Surgical Instructions    Your procedure is scheduled on Friday, May 26.  Report to [facility] Main Entrance "A" at 5:30 A.M., then check in with the Admitting office.  Call this number if you have problems the morning of surgery:  [phone]   If you have any questions prior to your surgery date call [phone]: Open Monday-Friday 8am-4pm    Remember:  Do not eat or drink after midnight the night before your surgery   Take these medicines the morning of surgery with A SIP OF WATER:  atorvastatin (LIPITOR)  anastrozole (ARIMIDEX)  pregabalin (LYRICA)  ALPRAZolam (XANAX) if needed fluticasone (FLONASE) if needed loratadine (CLARITIN) 10 if needed  Follow your surgeon's instructions on taking/stopping Aspirin and Plavix (clopidogrel).  If no instructions were given by your surgeon then you will need to call the office to get those instructions.     As of today, STOP taking any Aleve, Naproxen, Ibuprofen, Motrin, Advil, Goody's, BC's, all herbal medications, fish oil, and all vitamins.           Do not wear jewelry or makeup Do not wear lotions, powders, perfumes/colognes, or deodorant. Do not shave 48 hours prior to surgery.  Men may shave face and neck. Do not bring valuables to the hospital. Do not wear nail polish, gel polish, artificial nails, or any other type of covering on natural nails (fingers and toes) If you have artificial nails or gel coating that need to be removed by a nail salon, please have this removed prior to surgery. Artificial nails or gel coating may interfere with anesthesia's ability to adequately monitor your vital signs.  [facility] is not responsible for any belongings or valuables. .   Do NOT Smoke (Tobacco/Vaping)  24 hours prior to your procedure  If you use a CPAP at night, you may bring your mask for your overnight stay.   Contacts, glasses, hearing aids, dentures or partials may not be worn into surgery, please bring cases for these  belongings   For patients admitted to the hospital, discharge time will be determined by your treatment team.   Patients discharged the day of surgery will not be allowed to drive home, and someone needs to stay with them for 24 hours.   SURGICAL WAITING ROOM VISITATION Patients having surgery or a procedure in a hospital may have two support people. Children under the age of 49 must have an adult with them who is not the patient. They may stay in the waiting area during the procedure and may switch out with other visitors. If the patient needs to stay at the hospital during part of their recovery, the visitor guidelines for inpatient rooms apply.  Please refer to the [facility] website for the visitor guidelines for Inpatients (after your surgery is over and you are in a regular room).       Special instructions:    Oral Hygiene is also important to reduce your risk of infection.  Remember - BRUSH YOUR TEETH THE MORNING OF SURGERY WITH YOUR REGULAR TOOTHPASTE   [facility]- Preparing For Surgery  Before surgery, you can play an important role. Because skin is not sterile, your skin needs to be as free of germs as possible. You can reduce the number of germs on your skin by washing with CHG (chlorahexidine gluconate) Soap before surgery.  CHG is an antiseptic cleaner which kills germs and bonds with the skin to continue killing germs even after washing.     Please do  not use if you have an allergy to CHG or antibacterial soaps. If your skin becomes reddened/irritated stop using the CHG.  Do not shave (including legs and underarms) for at least 48 hours prior to first CHG shower. It is OK to shave your face.  Please follow these instructions carefully.     Shower the NIGHT BEFORE SURGERY and the MORNING OF SURGERY with CHG Soap.   If you chose to wash your hair, wash your hair first as usual with your normal shampoo. After you shampoo, rinse your hair and body thoroughly to remove  the shampoo.  Then [employer] and genitals (private parts) with your normal soap and rinse thoroughly to remove soap.  After that Use CHG Soap as you would any other liquid soap. You can apply CHG directly to the skin and wash gently with a scrungie or a clean washcloth.   Apply the CHG Soap to your body ONLY FROM THE NECK DOWN.  Do not use on open wounds or open sores. Avoid contact with your eyes, ears, mouth and genitals (private parts). Wash Face and genitals (private parts)  with your normal soap.   Wash thoroughly, paying special attention to the area where your surgery will be performed.  Thoroughly rinse your body with warm water from the neck down.  DO NOT shower/wash with your normal soap after using and rinsing off the CHG Soap.  Pat yourself dry with a CLEAN TOWEL.  Wear CLEAN PAJAMAS to bed the night before surgery  Place CLEAN SHEETS on your bed the night before your surgery  DO NOT SLEEP WITH PETS.   Day of Surgery:  Take a shower with CHG soap. Wear Clean/Comfortable clothing the morning of surgery Do not apply any deodorants/lotions.   Remember to brush your teeth WITH YOUR REGULAR TOOTHPASTE.    If you received a COVID test during your pre-op visit, it is requested that you wear a mask when out in public, stay away from anyone that may not be feeling well, and notify your surgeon if you develop symptoms. If you have been in contact with anyone that has tested positive in the last 10 days, please notify your surgeon.    Please read over the following fact sheets that you were given.

## 2021-11-17 ENCOUNTER — Other Ambulatory Visit: Payer: Self-pay

## 2021-11-17 ENCOUNTER — Encounter (HOSPITAL_COMMUNITY): Payer: Self-pay

## 2021-11-17 ENCOUNTER — Telehealth: Payer: Self-pay

## 2021-11-17 ENCOUNTER — Encounter (HOSPITAL_COMMUNITY)
Admission: RE | Admit: 2021-11-17 | Discharge: 2021-11-17 | Disposition: A | Discharge status: Home / Self Care | Payer: Medicare Other | Source: Ambulatory Visit | Attending: Surgery | Admitting: Surgery

## 2021-11-17 VITALS — BP 125/68 | HR 84 | Temp 98.2°F | Resp 18 | Ht 61.0 in | Wt 97.1 lb

## 2021-11-17 DIAGNOSIS — Z9221 Personal history of antineoplastic chemotherapy: Secondary | ICD-10-CM | POA: Insufficient documentation

## 2021-11-17 DIAGNOSIS — Z87891 Personal history of nicotine dependence: Secondary | ICD-10-CM | POA: Diagnosis not present

## 2021-11-17 DIAGNOSIS — Z01818 Encounter for other preprocedural examination: Secondary | ICD-10-CM

## 2021-11-17 DIAGNOSIS — Z8673 Personal history of transient ischemic attack (TIA), and cerebral infarction without residual deficits: Secondary | ICD-10-CM | POA: Diagnosis not present

## 2021-11-17 DIAGNOSIS — Z923 Personal history of irradiation: Secondary | ICD-10-CM | POA: Insufficient documentation

## 2021-11-17 DIAGNOSIS — B1920 Unspecified viral hepatitis C without hepatic coma: Secondary | ICD-10-CM | POA: Diagnosis not present

## 2021-11-17 DIAGNOSIS — I6529 Occlusion and stenosis of unspecified carotid artery: Secondary | ICD-10-CM | POA: Insufficient documentation

## 2021-11-17 DIAGNOSIS — E785 Hyperlipidemia, unspecified: Secondary | ICD-10-CM | POA: Insufficient documentation

## 2021-11-17 DIAGNOSIS — Z9011 Acquired absence of right breast and nipple: Secondary | ICD-10-CM | POA: Diagnosis not present

## 2021-11-17 DIAGNOSIS — I6523 Occlusion and stenosis of bilateral carotid arteries: Secondary | ICD-10-CM | POA: Diagnosis not present

## 2021-11-17 DIAGNOSIS — Z853 Personal history of malignant neoplasm of breast: Secondary | ICD-10-CM | POA: Insufficient documentation

## 2021-11-17 DIAGNOSIS — I10 Essential (primary) hypertension: Secondary | ICD-10-CM | POA: Diagnosis not present

## 2021-11-17 HISTORY — DX: Malignant (primary) neoplasm, unspecified: C80.1

## 2021-11-17 HISTORY — DX: Inflammatory liver disease, unspecified: K75.9

## 2021-11-17 LAB — COMPREHENSIVE METABOLIC PANEL
ALT: 61 U/L — ABNORMAL HIGH (ref 0–44)
AST: 63 U/L — ABNORMAL HIGH (ref 15–41)
Albumin: 3.6 g/dL (ref 3.5–5.0)
Alkaline Phosphatase: 61 U/L (ref 38–126)
Anion gap: 8 (ref 5–15)
BUN: 33 mg/dL — ABNORMAL HIGH (ref 8–23)
CO2: 27 mmol/L (ref 22–32)
Calcium: 9 mg/dL (ref 8.9–10.3)
Chloride: 101 mmol/L (ref 98–111)
Creatinine, Ser: 1.18 mg/dL — ABNORMAL HIGH (ref 0.44–1.00)
GFR, Estimated: 50 mL/min — ABNORMAL LOW (ref >60)
Glucose, Bld: 92 mg/dL (ref 70–99)
Potassium: 4.3 mmol/L (ref 3.5–5.1)
Sodium: 136 mmol/L (ref 135–145)
Total Bilirubin: 0.5 mg/dL (ref 0.3–1.2)
Total Protein: 7.1 g/dL (ref 6.5–8.1)

## 2021-11-17 LAB — TYPE AND SCREEN
ABO/RH(D): O POS
Antibody Screen: NEGATIVE

## 2021-11-17 LAB — URINALYSIS, ROUTINE W REFLEX MICROSCOPIC
Bacteria, UA: NONE SEEN
Bilirubin Urine: NEGATIVE
Glucose, UA: NEGATIVE mg/dL
Hgb urine dipstick: NEGATIVE
Ketones, ur: NEGATIVE mg/dL
Nitrite: NEGATIVE
Protein, ur: NEGATIVE mg/dL
Specific Gravity, Urine: 1.018 (ref 1.005–1.030)
pH: 5 (ref 5.0–8.0)

## 2021-11-17 LAB — CBC
HCT: 42.1 % (ref 36.0–46.0)
Hemoglobin: 14.3 g/dL (ref 12.0–15.0)
MCH: 30.9 pg (ref 26.0–34.0)
MCHC: 34 g/dL (ref 30.0–36.0)
MCV: 90.9 fL (ref 80.0–100.0)
Platelets: 217 10*3/uL (ref 150–400)
RBC: 4.63 MIL/uL (ref 3.87–5.11)
RDW: 12.9 % (ref 11.5–15.5)
WBC: 7.2 10*3/uL (ref 4.0–10.5)
nRBC: 0 % (ref 0.0–0.2)

## 2021-11-17 LAB — SURGICAL PCR SCREEN
MRSA, PCR: NEGATIVE
Staphylococcus aureus: NEGATIVE

## 2021-11-17 LAB — APTT: aPTT: 164 seconds (ref 24–36)

## 2021-11-17 LAB — PROTIME-INR
INR: 1 (ref 0.8–1.2)
Prothrombin Time: 13.4 seconds (ref 11.4–15.2)

## 2021-11-17 MED ORDER — SULFAMETHOXAZOLE-TRIMETHOPRIM 800-160 MG PO TABS: 1.0000 | ORAL_TABLET | Freq: Two times a day (BID) | ORAL | 0 refills | Status: DC

## 2021-11-17 NOTE — Progress Notes (Signed)
PTT resulted at 164. This is likely not accurate due to PT-INR being normal. Surgeon's office aware that pt was also adamant that labs be drawn from her port.

## 2021-11-17 NOTE — Telephone Encounter (Signed)
Contacted patient to review abnormal pre-op urinalysis results. Advised a Rx for Bactrim DS 1 tablet twice daily for 5 days has been called into pharmacy. Patient's friend Georgeanna Lea and patient in background verbalized understanding.

## 2021-11-17 NOTE — Progress Notes (Signed)
PCP - Emelia Loron, NP Cardiologist - denies  PPM/ICD - denies   Chest x-ray - 07/28/21 EKG - 11/17/21 Stress Test - denies ECHO - 08/20/21 Cardiac Cath - denies  Sleep Study - denies   DM- denies  ASA/Blood Thinner Instructions: Continue ASA and Plavix thru DOS   ERAS Protcol - no, NPO   COVID TEST- n/a   Anesthesia review: no  Patient denies shortness of breath, fever, cough and chest pain at PAT appointment   All instructions explained to the patient, with a verbal understanding of the material. Patient agrees to go over the instructions while at home for a better understanding. Patient also instructed to notify surgeon of any contact with COVID+ person or if she develops any symptoms. The opportunity to ask questions was provided.

## 2021-11-17 NOTE — Progress Notes (Signed)
Pt refused to be stuck by phlebotomy for pre-op labs. She was adamant that her labs be drawn from her port. IV team consult placed for them to draw her pre-op labs

## 2021-11-18 NOTE — Anesthesia Preprocedure Evaluation (Addendum)
Anesthesia Evaluation  Patient identified by MRN, date of birth, ID band Patient awake    Reviewed: Allergy & Precautions, NPO status , Patient's Chart, lab work & pertinent test results  History of Anesthesia Complications Negative for: history of anesthetic complications  Airway Mallampati: II  TM Distance: >3 FB Neck ROM: Full    Dental  (+) Missing, Poor Dentition, Dental Advisory Given   Pulmonary Current Smoker and Patient abstained from smoking.,     + decreased breath sounds      Cardiovascular hypertension,  Rhythm:Regular  1. Left ventricular ejection fraction, by estimation, is 55 to 60%. The  left ventricle has normal function. The left ventricle has no regional  wall motion abnormalities. Left ventricular diastolic parameters were  normal.  2. Right ventricular systolic function is normal. The right ventricular  size is normal.  3. The mitral valve is normal in structure. No evidence of mitral valve  regurgitation.  4. The aortic valve is tricuspid. Aortic valve regurgitation is not  visualized. Aortic valve sclerosis is present, with no evidence of aortic  valve stenosis.    Neuro/Psych PSYCHIATRIC DISORDERS Anxiety Depression Left arm paresis  Neuromuscular disease CVA, Residual Symptoms    GI/Hepatic negative GI ROS, (+) Hepatitis -, C  Endo/Other  negative endocrine ROS  Renal/GU Renal InsufficiencyRenal diseaseLab Results      Component                Value               Date                      CREATININE               1.18 (H)            11/17/2021                Musculoskeletal negative musculoskeletal ROS (+)   Abdominal   Peds  Hematology Lab Results      Component                Value               Date                      WBC                      7.2                 11/17/2021                HGB                      14.3                11/17/2021                HCT                       42.1                11/17/2021                MCV                      90.9  11/17/2021                PLT                      217                 11/17/2021             plavix    Anesthesia Other Findings Right breast, pt had R breast mastectomy including lymph node removal  Reproductive/Obstetrics                            Anesthesia Physical Anesthesia Plan  ASA: 3  Anesthesia Plan: General   Post-op Pain Management: Ofirmev IV (intra-op)*   Induction: Intravenous  PONV Risk Score and Plan: 2 and Ondansetron and Dexamethasone  Airway Management Planned: Oral ETT  Additional Equipment: Arterial line  Intra-op Plan:   Post-operative Plan: Extubation in OR  Informed Consent: I have reviewed the patients History and Physical, chart, labs and discussed the procedure including the risks, benefits and alternatives for the proposed anesthesia with the patient or authorized representative who has indicated his/her understanding and acceptance.     Dental advisory given  Plan Discussed with: CRNA  Anesthesia Plan Comments: (See PAT note written 11/18/2021 by Myra Gianotti, PA-C. )       Anesthesia Quick Evaluation

## 2021-11-18 NOTE — Progress Notes (Signed)
Anesthesia Chart Review:  Case: [id number] Date/Time: 11/21/21 0715   Procedure: Right Transcarotid Artery Revascularization (Right)   Anesthesia type: General   Pre-op diagnosis: Bilateral carotid stenosis   Location: [facility] OR ROOM 16 / [place] OR   Surgeons: Serafina Mitchell, MD       DISCUSSION: Patient is a 71 year old female scheduled for the above procedure.  History includes smoking, HTN, HLD, CVA, carotid artery stenosis, breast cancer (right breast IDC diagnosed 04/05/14, s/p chemotherapy, s/p right modified radical mastectomy 10/08/14, s/p radiation, Arimidex), hepatitis C.   She moved from [facility] to [place] around 01/2020. In June 2021 she developed left hand weakness. At some point she did see a neurologist in [facility] and was referred to an orthopedic hand surgeon for potential decompression. Due to her move and insurance changes, she was not seen by orthopedic surgery until 02/2021 who ultimately referred her to neurologist Dr. Krista Blue. Work-up included brain MRI that showed chronic right posterior frontal infarct with abnormal right ICA flow signal. Carotid US showed 80-99% BICA stenosis. Echo did not show any obvious source of CVA. She was referred to vascular surgery. Per Dr. 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 Staged interventions anticipated, but given right brain CVA, right TCAR planned. She was started on Plavix 11/03/21 and to continue ASA and Plavix perioperatively.   Dr. Trula Slade also noted, "Patient has a Port-A-Cath in place which she would like to be used for blood draws.  She also suffers from postoperative nausea and vomiting and wants to make sure she is adequately treated." She would not allow attempts at peripheral blood draw at PAT, so IV team consulted for lab draw via her [place]. PTT was elevated and thought most likely related to central catheter lab draw. She needs a 2nd  T&S on arrival and can attempt to get new PTT.   Anesthesia team to evaluate on the day of surgery.    VS: BP 125/68   Pulse 84   Temp 36.8 C (Oral)   Resp 18   Ht '5\' 1"'$  (1.549 m)   Wt 44 kg   SpO2 98%   BMI 18.35 kg/m   PROVIDERS: Emelia Loron, NP is PCP  Benay Pike, MD is HEM-ONC Marcial Pacas, MD is neurologist   LABS: Preoperative labs noted. AST/ALT mildly elevated in the 60's, up from 08/04/21. CBC normal. PT/INR normal; however, aPTT was elevated at 164--patient refused peripheral IV stick, so IV team drew labs from her Port-a-cath, so question accuracy of aPTT results. Surgeon's office notified of PTT and UA results. She is being started on Bactrim.  (all labs ordered are listed, but only abnormal results are displayed)  Labs Reviewed  COMPREHENSIVE METABOLIC PANEL - Abnormal; Notable for the following components:      Result Value   BUN 33 (*)    Creatinine, Ser 1.18 (*)    AST 63 (*)    ALT 61 (*)    GFR, Estimated 50 (*)    All other components within normal limits  APTT - Abnormal; Notable for the following components:   aPTT 164 (*)    All other components within normal limits  URINALYSIS, ROUTINE W REFLEX MICROSCOPIC - Abnormal; Notable for the following components:   Leukocytes,Ua TRACE (*)    All other components within normal limits  SURGICAL PCR SCREEN  CBC  PROTIME-INR  TYPE AND SCREEN     IMAGES: CTA Neck  10/31/21: IMPRESSION: 1. Positive for High-grade Right > Left ICA origin/bulb stenoses: - RADIOGRAPHIC STRING SIGN along an 11 mm segment of the Right with patent but only thread-like downstream Right ICA until better reconstitution in the visible cavernous segment. - High-grade Left ICA bulb stenosis approaching a radiographic string sign, but with normal downstream ICA caliber. 2. Occluded Right Vertebral Artery origin with diminutive reconstituted V3 and V4 segments. Right PICA remains patent. 3. Dominant appearing Left Vertebral Artery  without stenosis. 4. Aortic Atherosclerosis (ICD10-I70.0) and Emphysema (ICD10-J43.9).  MRI Brain 08/06/21: IMPRESSION:  -Chronic right posterior frontal cortical ischemic infarction along the primary motor strip. -Right internal carotid artery flow void signal abnormality may be related to proximal stenosis or occlusion. -Mild chronic small vessel ischemic disease. -No acute findings.   MRI C-spine 08/06/21: IMPRESSION: - At C5-6 uncovertebral joint hypertrophy at moderate bilateral foraminal stenosis. - At C3-4 disc bulging and uncovertebral joint hypertrophy with mild bilateral foraminal stenosis.   1V CXR 07/28/21:  FINDINGS: Normal sized heart. Left subclavian porta catheter with its tip in the superior vena cava. No pneumothorax. Probable nipple shadows and overlapping ribs at both lateral lung bases. The lungs are mildly hyperexpanded with mild diffuse peribronchial thickening. Right axillary surgical clips. Unremarkable bones. IMPRESSION: 1. Left subclavian porta catheter tip in the superior vena cava. 2. Mild changes of COPD and chronic bronchitis.    EKG: 11/17/21: NSR   CV: Echo 08/20/21: IMPRESSIONS   1. Left ventricular ejection fraction, by estimation, is 55 to 60%. The  left ventricle has normal function. The left ventricle has no regional  wall motion abnormalities. Left ventricular diastolic parameters were  normal.   2. Right ventricular systolic function is normal. The right ventricular  size is normal.   3. The mitral valve is normal in structure. No evidence of mitral valve  regurgitation.   4. The aortic valve is tricuspid. Aortic valve regurgitation is not  visualized. Aortic valve sclerosis is present, with no evidence of aortic  valve stenosis.  - IAS/Shunts: The atrial septum is grossly normal.   US Carotid 08/20/21: Summary:  - Right Carotid: Velocities in the right ICA are consistent with a 80-99%                 stenosis. The ECA appears >50%  stenosed.  - Left Carotid: Velocities in the left ICA are consistent with a 80-99%  stenosis.                The ECA appears <50% stenosed.  - Vertebrals:  Left vertebral artery demonstrates antegrade flow. Right  vertebral               artery demonstrates an occlusion.  - Subclavians: Normal flow hemodynamics were seen in bilateral subclavian               arteries.    Past Medical History:  Diagnosis Date   Anxiety    BP (high blood pressure)    Cancer (HCC)    Right breast, pt had R breast mastectomy including lymph node removal   Depression    Hepatitis    Hep C, pt states she has not had treatment   High cholesterol    Neuropathy    Stroke [facility]) 2021    Past Surgical History:  Procedure Laterality Date   breast cancer Right 2016   MASTECTOMY Right 2016   including lymph node removal   PORTACATH PLACEMENT  2016    MEDICATIONS:  ALPRAZolam (  XANAX) 0.25 MG tablet   aluminum hydroxide-magnesium carbonate (GAVISCON) 95-358 MG/15ML SUSP   anastrozole (ARIMIDEX) 1 MG tablet   aspirin EC 81 MG tablet   atorvastatin (LIPITOR) 80 MG tablet   cholecalciferol (VITAMIN D3) 25 MCG (1000 UNIT) tablet   clopidogrel (PLAVIX) 75 MG tablet   fluticasone (FLONASE) 50 MCG/ACT nasal spray   lisinopril-hydrochlorothiazide (ZESTORETIC) 10-12.5 MG tablet   loratadine (CLARITIN) 10 MG tablet   OVER THE COUNTER MEDICATION   polyethylene glycol (MIRALAX / GLYCOLAX) 17 g packet   pregabalin (LYRICA) 50 MG capsule   sulfamethoxazole-trimethoprim (BACTRIM DS) 800-160 MG tablet   No current facility-administered medications for this encounter.    Myra Gianotti, PA-C Surgical Short Stay/Anesthesiology [facility] Phone [phone] [facility] Phone [phone] 11/18/2021 12:50 PM

## 2021-11-21 ENCOUNTER — Encounter (HOSPITAL_COMMUNITY)
Admission: RE | Disposition: A | Discharge status: Home / Self Care | Payer: Self-pay | Source: Home / Self Care | Attending: Surgery

## 2021-11-21 ENCOUNTER — Other Ambulatory Visit: Payer: Self-pay

## 2021-11-21 ENCOUNTER — Inpatient Hospital Stay (HOSPITAL_COMMUNITY)
Admission: RE | Admit: 2021-11-21 | Discharge: 2021-11-23 | DRG: 036 | Disposition: A | Discharge status: Home / Self Care | Payer: Medicare Other | Attending: Surgery | Admitting: Surgery

## 2021-11-21 ENCOUNTER — Inpatient Hospital Stay (HOSPITAL_COMMUNITY): Payer: Medicare Other | Admitting: Physician Assistant

## 2021-11-21 ENCOUNTER — Encounter (HOSPITAL_COMMUNITY): Payer: Self-pay | Admitting: Surgery

## 2021-11-21 ENCOUNTER — Inpatient Hospital Stay (HOSPITAL_COMMUNITY): Payer: Medicare Other | Admitting: Vascular Surgery

## 2021-11-21 ENCOUNTER — Inpatient Hospital Stay (HOSPITAL_COMMUNITY): Payer: Medicare Other

## 2021-11-21 DIAGNOSIS — I10 Essential (primary) hypertension: Secondary | ICD-10-CM | POA: Diagnosis present

## 2021-11-21 DIAGNOSIS — Z885 Allergy status to narcotic agent status: Secondary | ICD-10-CM

## 2021-11-21 DIAGNOSIS — F419 Anxiety disorder, unspecified: Secondary | ICD-10-CM | POA: Diagnosis present

## 2021-11-21 DIAGNOSIS — I63231 Cerebral infarction due to unspecified occlusion or stenosis of right carotid arteries: Secondary | ICD-10-CM | POA: Diagnosis not present

## 2021-11-21 DIAGNOSIS — F17210 Nicotine dependence, cigarettes, uncomplicated: Secondary | ICD-10-CM | POA: Diagnosis present

## 2021-11-21 DIAGNOSIS — I9581 Postprocedural hypotension: Secondary | ICD-10-CM | POA: Diagnosis not present

## 2021-11-21 DIAGNOSIS — E7800 Pure hypercholesterolemia, unspecified: Secondary | ICD-10-CM | POA: Diagnosis present

## 2021-11-21 DIAGNOSIS — Z79899 Other long term (current) drug therapy: Secondary | ICD-10-CM | POA: Diagnosis not present

## 2021-11-21 DIAGNOSIS — F418 Other specified anxiety disorders: Secondary | ICD-10-CM

## 2021-11-21 DIAGNOSIS — Z9011 Acquired absence of right breast and nipple: Secondary | ICD-10-CM

## 2021-11-21 DIAGNOSIS — F32A Depression, unspecified: Secondary | ICD-10-CM | POA: Diagnosis present

## 2021-11-21 DIAGNOSIS — I69398 Other sequelae of cerebral infarction: Secondary | ICD-10-CM | POA: Diagnosis not present

## 2021-11-21 DIAGNOSIS — I6521 Occlusion and stenosis of right carotid artery: Principal | ICD-10-CM

## 2021-11-21 DIAGNOSIS — I6529 Occlusion and stenosis of unspecified carotid artery: Secondary | ICD-10-CM

## 2021-11-21 DIAGNOSIS — Z888 Allergy status to other drugs, medicaments and biological substances status: Secondary | ICD-10-CM | POA: Diagnosis not present

## 2021-11-21 DIAGNOSIS — Z91040 Latex allergy status: Secondary | ICD-10-CM | POA: Diagnosis not present

## 2021-11-21 DIAGNOSIS — B1920 Unspecified viral hepatitis C without hepatic coma: Secondary | ICD-10-CM

## 2021-11-21 DIAGNOSIS — G629 Polyneuropathy, unspecified: Secondary | ICD-10-CM | POA: Diagnosis present

## 2021-11-21 DIAGNOSIS — Z853 Personal history of malignant neoplasm of breast: Secondary | ICD-10-CM | POA: Diagnosis not present

## 2021-11-21 DIAGNOSIS — R791 Abnormal coagulation profile: Principal | ICD-10-CM

## 2021-11-21 DIAGNOSIS — Z7982 Long term (current) use of aspirin: Secondary | ICD-10-CM

## 2021-11-21 HISTORY — PX: TRANSCAROTID ARTERY REVASCULARIZATIONÂ: SHX6778

## 2021-11-21 HISTORY — PX: EXTRACORPOREAL SHOCK WAVE LITHOTRIPSY: SHX1557

## 2021-11-21 HISTORY — PX: ULTRASOUND GUIDANCE FOR VASCULAR ACCESS: SHX6516

## 2021-11-21 LAB — CBC
HCT: 35.5 % — ABNORMAL LOW (ref 36.0–46.0)
Hemoglobin: 11.4 g/dL — ABNORMAL LOW (ref 12.0–15.0)
MCH: 29.6 pg (ref 26.0–34.0)
MCHC: 32.1 g/dL (ref 30.0–36.0)
MCV: 92.2 fL (ref 80.0–100.0)
Platelets: 162 10*3/uL (ref 150–400)
RBC: 3.85 MIL/uL — ABNORMAL LOW (ref 3.87–5.11)
RDW: 13.2 % (ref 11.5–15.5)
WBC: 4.6 10*3/uL (ref 4.0–10.5)
nRBC: 0 % (ref 0.0–0.2)

## 2021-11-21 LAB — CREATININE, SERUM
Creatinine, Ser: 1.36 mg/dL — ABNORMAL HIGH (ref 0.44–1.00)
GFR, Estimated: 42 mL/min — ABNORMAL LOW (ref >60)

## 2021-11-21 LAB — ABO/RH: ABO/RH(D): O POS

## 2021-11-21 LAB — POCT ACTIVATED CLOTTING TIME
Activated Clotting Time: 251 seconds
Activated Clotting Time: 257 seconds

## 2021-11-21 LAB — APTT: aPTT: 33 seconds (ref 24–36)

## 2021-11-21 SURGERY — TRANSCAROTID ARTERY REVASCULARIZATION (TCAR)
Anesthesia: General | Site: Neck | Laterality: Right

## 2021-11-21 MED ORDER — ONDANSETRON HCL 4 MG/2ML IJ SOLN
INTRAMUSCULAR | Status: DC | PRN
  Administered 2021-11-21: 4 mg via INTRAVENOUS

## 2021-11-21 MED ORDER — CEFAZOLIN SODIUM-DEXTROSE 2-4 GM/100ML-% IV SOLN
2.0000 g | INTRAVENOUS | Status: AC
  Administered 2021-11-21: 2 g via INTRAVENOUS
  Filled 2021-11-21: qty 100

## 2021-11-21 MED ORDER — SODIUM CHLORIDE 0.9 % IV SOLN
500.0000 mL | Freq: Once | INTRAVENOUS | Status: AC | PRN
  Administered 2021-11-21: 500 mL via INTRAVENOUS

## 2021-11-21 MED ORDER — HYDRALAZINE HCL 20 MG/ML IJ SOLN: 5.0000 mg | INTRAMUSCULAR | Status: DC | PRN

## 2021-11-21 MED ORDER — SODIUM CHLORIDE 0.9 % IV SOLN: INTRAVENOUS | Status: DC

## 2021-11-21 MED ORDER — PSEUDOEPHEDRINE HCL 30 MG PO TABS
30.0000 mg | ORAL_TABLET | Freq: Four times a day (QID) | ORAL | Status: DC
  Administered 2021-11-21 – 2021-11-23 (×6): 30 mg via ORAL
  Filled 2021-11-21 (×11): qty 1

## 2021-11-21 MED ORDER — ASPIRIN 81 MG PO TBEC
81.0000 mg | DELAYED_RELEASE_TABLET | Freq: Every day | ORAL | Status: DC
  Administered 2021-11-22 – 2021-11-23 (×2): 81 mg via ORAL
  Filled 2021-11-21 (×2): qty 1

## 2021-11-21 MED ORDER — GUAIFENESIN-DM 100-10 MG/5ML PO SYRP: 15.0000 mL | ORAL_SOLUTION | ORAL | Status: DC | PRN

## 2021-11-21 MED ORDER — MIDAZOLAM HCL 2 MG/2ML IJ SOLN
INTRAMUSCULAR | Status: AC
  Filled 2021-11-21: qty 2

## 2021-11-21 MED ORDER — SENNOSIDES-DOCUSATE SODIUM 8.6-50 MG PO TABS: 1.0000 | ORAL_TABLET | Freq: Every evening | ORAL | Status: DC | PRN

## 2021-11-21 MED ORDER — FENTANYL CITRATE (PF) 250 MCG/5ML IJ SOLN
INTRAMUSCULAR | Status: AC
  Filled 2021-11-21: qty 5

## 2021-11-21 MED ORDER — IODIXANOL 320 MG/ML IV SOLN
INTRAVENOUS | Status: DC | PRN
  Administered 2021-11-21: 100 mL via INTRA_ARTERIAL

## 2021-11-21 MED ORDER — PHENYLEPHRINE 80 MCG/ML (10ML) SYRINGE FOR IV PUSH (FOR BLOOD PRESSURE SUPPORT)
PREFILLED_SYRINGE | INTRAVENOUS | Status: DC | PRN
  Administered 2021-11-21 (×4): 80 ug via INTRAVENOUS

## 2021-11-21 MED ORDER — PANTOPRAZOLE SODIUM 40 MG PO TBEC
40.0000 mg | DELAYED_RELEASE_TABLET | Freq: Every day | ORAL | Status: DC
  Administered 2021-11-21 – 2021-11-23 (×3): 40 mg via ORAL
  Filled 2021-11-21 (×3): qty 1

## 2021-11-21 MED ORDER — PSEUDOEPHEDRINE HCL 30 MG PO TABS: 30.0000 mg | ORAL_TABLET | Freq: Four times a day (QID) | ORAL | Status: DC

## 2021-11-21 MED ORDER — FENTANYL CITRATE (PF) 100 MCG/2ML IJ SOLN: 25.0000 ug | INTRAMUSCULAR | Status: DC | PRN

## 2021-11-21 MED ORDER — HEPARIN SODIUM (PORCINE) 1000 UNIT/ML IJ SOLN
INTRAMUSCULAR | Status: DC | PRN
  Administered 2021-11-21: 5000 [IU] via INTRAVENOUS

## 2021-11-21 MED ORDER — ROCURONIUM BROMIDE 10 MG/ML (PF) SYRINGE
PREFILLED_SYRINGE | INTRAVENOUS | Status: DC | PRN
  Administered 2021-11-21: 60 mg via INTRAVENOUS

## 2021-11-21 MED ORDER — CHLORHEXIDINE GLUCONATE CLOTH 2 % EX PADS: 6.0000 | MEDICATED_PAD | Freq: Once | CUTANEOUS | Status: DC

## 2021-11-21 MED ORDER — PROPOFOL 10 MG/ML IV BOLUS
INTRAVENOUS | Status: AC
  Filled 2021-11-21: qty 20

## 2021-11-21 MED ORDER — GLYCOPYRROLATE 0.2 MG/ML IJ SOLN
INTRAMUSCULAR | Status: DC | PRN
  Administered 2021-11-21: .2 mg via INTRAVENOUS

## 2021-11-21 MED ORDER — MORPHINE SULFATE (PF) 2 MG/ML IV SOLN: 2.0000 mg | INTRAVENOUS | Status: DC | PRN

## 2021-11-21 MED ORDER — SODIUM CHLORIDE 0.9% FLUSH
3.0000 mL | INTRAVENOUS | Status: DC | PRN
Start: 2021-11-21 — End: 2021-11-23

## 2021-11-21 MED ORDER — CEFAZOLIN SODIUM-DEXTROSE 2-4 GM/100ML-% IV SOLN
2.0000 g | INTRAVENOUS | Status: AC
  Administered 2021-11-22: 2 g via INTRAVENOUS
  Filled 2021-11-21: qty 100

## 2021-11-21 MED ORDER — ACETAMINOPHEN 500 MG PO TABS: 1000.0000 mg | ORAL_TABLET | Freq: Once | ORAL | Status: DC | PRN

## 2021-11-21 MED ORDER — PREGABALIN 25 MG PO CAPS
50.0000 mg | ORAL_CAPSULE | Freq: Two times a day (BID) | ORAL | Status: DC
  Administered 2021-11-21 – 2021-11-23 (×4): 50 mg via ORAL
  Filled 2021-11-21 (×4): qty 2

## 2021-11-21 MED ORDER — MIDAZOLAM HCL 2 MG/2ML IJ SOLN
INTRAMUSCULAR | Status: DC | PRN
Start: 2021-11-21 — End: 2021-11-21
  Administered 2021-11-21: 2 mg via INTRAVENOUS

## 2021-11-21 MED ORDER — PHENYLEPHRINE HCL-NACL 20-0.9 MG/250ML-% IV SOLN
INTRAVENOUS | Status: DC | PRN
  Administered 2021-11-21: 50 ug/min via INTRAVENOUS

## 2021-11-21 MED ORDER — ORAL CARE MOUTH RINSE: 15.0000 mL | Freq: Once | OROMUCOSAL | Status: AC

## 2021-11-21 MED ORDER — PHENOL 1.4 % MT LIQD: 1.0000 | OROMUCOSAL | Status: DC | PRN

## 2021-11-21 MED ORDER — DOPAMINE-DEXTROSE 3.2-5 MG/ML-% IV SOLN
5.0000 ug/kg/min | INTRAVENOUS | Status: DC
  Administered 2021-11-21: 5 ug/kg/min via INTRAVENOUS
  Filled 2021-11-21: qty 250

## 2021-11-21 MED ORDER — OXYCODONE-ACETAMINOPHEN 5-325 MG PO TABS: 1.0000 | ORAL_TABLET | ORAL | Status: DC | PRN

## 2021-11-21 MED ORDER — HEPARIN SODIUM (PORCINE) 5000 UNIT/ML IJ SOLN
5000.0000 [IU] | Freq: Three times a day (TID) | INTRAMUSCULAR | Status: DC
  Administered 2021-11-22 – 2021-11-23 (×4): 5000 [IU] via SUBCUTANEOUS
  Filled 2021-11-21 (×4): qty 1

## 2021-11-21 MED ORDER — AMISULPRIDE (ANTIEMETIC) 5 MG/2ML IV SOLN
INTRAVENOUS | Status: AC
  Filled 2021-11-21: qty 4

## 2021-11-21 MED ORDER — ACETAMINOPHEN 10 MG/ML IV SOLN: 1000.0000 mg | Freq: Once | INTRAVENOUS | Status: DC | PRN

## 2021-11-21 MED ORDER — MAGNESIUM SULFATE 2 GM/50ML IV SOLN: 2.0000 g | Freq: Every day | INTRAVENOUS | Status: DC | PRN

## 2021-11-21 MED ORDER — POTASSIUM CHLORIDE CRYS ER 20 MEQ PO TBCR: 20.0000 meq | EXTENDED_RELEASE_TABLET | Freq: Every day | ORAL | Status: DC | PRN

## 2021-11-21 MED ORDER — GLYCOPYRROLATE PF 0.2 MG/ML IJ SOSY
PREFILLED_SYRINGE | INTRAMUSCULAR | Status: AC
  Filled 2021-11-21: qty 1

## 2021-11-21 MED ORDER — PROTAMINE SULFATE 10 MG/ML IV SOLN
INTRAVENOUS | Status: DC | PRN
  Administered 2021-11-21: 50 mg via INTRAVENOUS

## 2021-11-21 MED ORDER — LISINOPRIL 10 MG PO TABS: 10.0000 mg | ORAL_TABLET | Freq: Every day | ORAL | Status: DC

## 2021-11-21 MED ORDER — LABETALOL HCL 5 MG/ML IV SOLN: 10.0000 mg | INTRAVENOUS | Status: DC | PRN

## 2021-11-21 MED ORDER — ANASTROZOLE 1 MG PO TABS
1.0000 mg | ORAL_TABLET | Freq: Every day | ORAL | Status: DC
  Administered 2021-11-22 – 2021-11-23 (×2): 1 mg via ORAL
  Filled 2021-11-21 (×3): qty 1

## 2021-11-21 MED ORDER — SODIUM CHLORIDE 0.9 % IV SOLN: 250.0000 mL | INTRAVENOUS | Status: DC | PRN

## 2021-11-21 MED ORDER — 0.9 % SODIUM CHLORIDE (POUR BTL) OPTIME
TOPICAL | Status: DC | PRN
  Administered 2021-11-21: 1000 mL

## 2021-11-21 MED ORDER — HEMOSTATIC AGENTS (NO CHARGE) OPTIME
TOPICAL | Status: DC | PRN
Start: 2021-11-21 — End: 2021-11-21
  Administered 2021-11-21: 1 via TOPICAL

## 2021-11-21 MED ORDER — CHLORHEXIDINE GLUCONATE 0.12 % MT SOLN
15.0000 mL | Freq: Once | OROMUCOSAL | Status: AC
  Administered 2021-11-21: 15 mL via OROMUCOSAL
  Filled 2021-11-21: qty 15

## 2021-11-21 MED ORDER — ACETAMINOPHEN 650 MG RE SUPP: 325.0000 mg | RECTAL | Status: DC | PRN

## 2021-11-21 MED ORDER — METOPROLOL TARTRATE 5 MG/5ML IV SOLN: 2.0000 mg | INTRAVENOUS | Status: DC | PRN

## 2021-11-21 MED ORDER — DEXAMETHASONE SODIUM PHOSPHATE 10 MG/ML IJ SOLN
INTRAMUSCULAR | Status: DC | PRN
  Administered 2021-11-21: 10 mg via INTRAVENOUS

## 2021-11-21 MED ORDER — ALPRAZOLAM 0.25 MG PO TABS
0.2500 mg | ORAL_TABLET | Freq: Two times a day (BID) | ORAL | Status: DC | PRN
  Administered 2021-11-21 – 2021-11-23 (×5): 0.25 mg via ORAL
  Filled 2021-11-21 (×5): qty 1

## 2021-11-21 MED ORDER — EPHEDRINE SULFATE-NACL 50-0.9 MG/10ML-% IV SOSY
PREFILLED_SYRINGE | INTRAVENOUS | Status: DC | PRN
  Administered 2021-11-21 (×2): 10 mg via INTRAVENOUS

## 2021-11-21 MED ORDER — HEPARIN SODIUM (PORCINE) 1000 UNIT/ML IJ SOLN
INTRAMUSCULAR | Status: AC
  Filled 2021-11-21: qty 10

## 2021-11-21 MED ORDER — PROPOFOL 10 MG/ML IV BOLUS
INTRAVENOUS | Status: DC | PRN
  Administered 2021-11-21: 60 mg via INTRAVENOUS
  Administered 2021-11-21: 50 mg via INTRAVENOUS

## 2021-11-21 MED ORDER — PHENYLEPHRINE 80 MCG/ML (10ML) SYRINGE FOR IV PUSH (FOR BLOOD PRESSURE SUPPORT)
PREFILLED_SYRINGE | INTRAVENOUS | Status: AC
  Filled 2021-11-21: qty 10

## 2021-11-21 MED ORDER — SULFAMETHOXAZOLE-TRIMETHOPRIM 800-160 MG PO TABS: 1.0000 | ORAL_TABLET | Freq: Two times a day (BID) | ORAL | Status: DC

## 2021-11-21 MED ORDER — BISACODYL 5 MG PO TBEC: 5.0000 mg | DELAYED_RELEASE_TABLET | Freq: Every day | ORAL | Status: DC | PRN

## 2021-11-21 MED ORDER — DOCUSATE SODIUM 100 MG PO CAPS
100.0000 mg | ORAL_CAPSULE | Freq: Every day | ORAL | Status: DC
  Administered 2021-11-22 – 2021-11-23 (×2): 100 mg via ORAL
  Filled 2021-11-21 (×2): qty 1

## 2021-11-21 MED ORDER — LORATADINE 10 MG PO TABS: 10.0000 mg | ORAL_TABLET | Freq: Every day | ORAL | Status: DC | PRN

## 2021-11-21 MED ORDER — ATORVASTATIN CALCIUM 80 MG PO TABS
80.0000 mg | ORAL_TABLET | Freq: Every day | ORAL | Status: DC
  Administered 2021-11-22 – 2021-11-23 (×2): 80 mg via ORAL
  Filled 2021-11-21 (×2): qty 1

## 2021-11-21 MED ORDER — ALUM & MAG HYDROXIDE-SIMETH 200-200-20 MG/5ML PO SUSP: 15.0000 mL | ORAL | Status: DC | PRN

## 2021-11-21 MED ORDER — HEPARIN 6000 UNIT IRRIGATION SOLUTION
Status: DC | PRN
  Administered 2021-11-21: 1

## 2021-11-21 MED ORDER — ONDANSETRON HCL 4 MG/2ML IJ SOLN: 4.0000 mg | Freq: Four times a day (QID) | INTRAMUSCULAR | Status: DC | PRN

## 2021-11-21 MED ORDER — ROCURONIUM BROMIDE 10 MG/ML (PF) SYRINGE
PREFILLED_SYRINGE | INTRAVENOUS | Status: AC
  Filled 2021-11-21: qty 10

## 2021-11-21 MED ORDER — ACETAMINOPHEN 325 MG PO TABS
325.0000 mg | ORAL_TABLET | ORAL | Status: DC | PRN
  Administered 2021-11-21: 650 mg via ORAL
  Filled 2021-11-21: qty 2

## 2021-11-21 MED ORDER — LISINOPRIL-HYDROCHLOROTHIAZIDE 10-12.5 MG PO TABS: 1.0000 | ORAL_TABLET | Freq: Every day | ORAL | Status: DC

## 2021-11-21 MED ORDER — CLOPIDOGREL BISULFATE 75 MG PO TABS
75.0000 mg | ORAL_TABLET | Freq: Every day | ORAL | Status: DC
  Administered 2021-11-22 – 2021-11-23 (×2): 75 mg via ORAL
  Filled 2021-11-21 (×2): qty 1

## 2021-11-21 MED ORDER — SUGAMMADEX SODIUM 200 MG/2ML IV SOLN
INTRAVENOUS | Status: DC | PRN
  Administered 2021-11-21: 200 mg via INTRAVENOUS

## 2021-11-21 MED ORDER — HYDROCHLOROTHIAZIDE 12.5 MG PO TABS
12.5000 mg | ORAL_TABLET | Freq: Every day | ORAL | Status: DC
Start: 2021-11-22 — End: 2021-11-22

## 2021-11-21 MED ORDER — SODIUM CHLORIDE 0.9% FLUSH
3.0000 mL | Freq: Two times a day (BID) | INTRAVENOUS | Status: DC
  Administered 2021-11-21 – 2021-11-23 (×3): 3 mL via INTRAVENOUS

## 2021-11-21 MED ORDER — FENTANYL CITRATE (PF) 250 MCG/5ML IJ SOLN
INTRAMUSCULAR | Status: DC | PRN
  Administered 2021-11-21: 50 ug via INTRAVENOUS
  Administered 2021-11-21: 100 ug via INTRAVENOUS

## 2021-11-21 MED ORDER — AMISULPRIDE (ANTIEMETIC) 5 MG/2ML IV SOLN
10.0000 mg | Freq: Once | INTRAVENOUS | Status: AC
  Administered 2021-11-21: 10 mg via INTRAVENOUS

## 2021-11-21 MED ORDER — LACTATED RINGERS IV SOLN
INTRAVENOUS | Status: DC
Start: 2021-11-21 — End: 2021-11-21

## 2021-11-21 MED ORDER — ACETAMINOPHEN 160 MG/5ML PO SOLN: 1000.0000 mg | Freq: Once | ORAL | Status: DC | PRN

## 2021-11-21 MED ORDER — LIDOCAINE 2% (20 MG/ML) 5 ML SYRINGE
INTRAMUSCULAR | Status: AC
  Filled 2021-11-21: qty 5

## 2021-11-21 SURGICAL SUPPLY — 56 items
BAG BANDED W/RUBBER/TAPE 36X54 (MISCELLANEOUS) ×3 IMPLANT
BALLN STERLING RX 5X30X80 (BALLOONS) ×3
BALLOON STERLING RX 5X30X80 (BALLOONS) IMPLANT
CANISTER SUCT 3000ML PPV (MISCELLANEOUS) ×3 IMPLANT
CATH BEACON 5 .035 65 VANSC3 (CATHETERS) ×1 IMPLANT
CATH SHOCKWAVE S4 4.0X40 (CATHETERS) ×1 IMPLANT
CATH SUCT 10FR WHISTLE TIP (CATHETERS) ×3 IMPLANT
CLIP TI WIDE RED SMALL 6 (CLIP) ×1 IMPLANT
CLIP VESOCCLUDE MED 6/CT (CLIP) ×3 IMPLANT
CLIP VESOCCLUDE SM WIDE 6/CT (CLIP) ×3 IMPLANT
COVER DOME SNAP 22 D (MISCELLANEOUS) ×3 IMPLANT
COVER PROBE W GEL 5X96 (DRAPES) ×4 IMPLANT
DERMABOND ADVANCED (GAUZE/BANDAGES/DRESSINGS) ×1
DERMABOND ADVANCED .7 DNX12 (GAUZE/BANDAGES/DRESSINGS) ×2 IMPLANT
DRAPE FEMORAL ANGIO 80X135IN (DRAPES) ×3 IMPLANT
ELECT REM PT RETURN 9FT ADLT (ELECTROSURGICAL) ×3
ELECTRODE REM PT RTRN 9FT ADLT (ELECTROSURGICAL) ×2 IMPLANT
GLOVE SURG SS PI 7.5 STRL IVOR (GLOVE) ×9 IMPLANT
GOWN STRL REUS W/ TWL LRG LVL3 (GOWN DISPOSABLE) ×4 IMPLANT
GOWN STRL REUS W/ TWL XL LVL3 (GOWN DISPOSABLE) ×2 IMPLANT
GOWN STRL REUS W/TWL LRG LVL3 (GOWN DISPOSABLE) ×2
GOWN STRL REUS W/TWL XL LVL3 (GOWN DISPOSABLE) ×1
GUIDEWIRE ENROUTE 0.014 (WIRE) ×3 IMPLANT
GUIDEWIRE STR TIP .014X300X8 (WIRE) ×1 IMPLANT
HEMOSTAT SNOW SURGICEL 2X4 (HEMOSTASIS) IMPLANT
INTRODUCER KIT GALT 7CM (INTRODUCER) ×1
KIT BASIN OR (CUSTOM PROCEDURE TRAY) ×3 IMPLANT
KIT ENCORE 26 ADVANTAGE (KITS) ×3 IMPLANT
KIT INTRODUCER GALT 7 (INTRODUCER) ×2 IMPLANT
KIT TURNOVER KIT B (KITS) ×3 IMPLANT
NDL HYPO 25GX1X1/2 BEV (NEEDLE) IMPLANT
NEEDLE HYPO 25GX1X1/2 BEV (NEEDLE) IMPLANT
PACK CAROTID (CUSTOM PROCEDURE TRAY) ×3 IMPLANT
POSITIONER HEAD DONUT 9IN (MISCELLANEOUS) ×3 IMPLANT
SET MICROPUNCTURE 5F STIFF (MISCELLANEOUS) IMPLANT
SPOGE SURGIFLO 8M (HEMOSTASIS) ×1
SPONGE SURGIFLO 8M (HEMOSTASIS) IMPLANT
SPONGE T-LAP 18X18 ~~LOC~~+RFID (SPONGE) ×1 IMPLANT
STENT TRANSCAROTID SYSTEM 8X30 (Permanent Stent) ×1 IMPLANT
SURGIFLO W/THROMBIN 8M KIT (HEMOSTASIS) IMPLANT
SUT PROLENE 5 0 C 1 24 (SUTURE) ×5 IMPLANT
SUT PROLENE 6 0 BV (SUTURE) ×1 IMPLANT
SUT SILK 2 0 PERMA HAND 18 BK (SUTURE) ×3 IMPLANT
SUT SILK 2 0 SH (SUTURE) ×3 IMPLANT
SUT VIC AB 3-0 SH 27 (SUTURE) ×1
SUT VIC AB 3-0 SH 27X BRD (SUTURE) ×4 IMPLANT
SUT VIC AB 4-0 PS2 27 (SUTURE) ×3 IMPLANT
SYR 10ML LL (SYRINGE) ×9 IMPLANT
SYR 20ML LL LF (SYRINGE) ×3 IMPLANT
SYR CONTROL 10ML LL (SYRINGE) IMPLANT
SYSTEM TRANSCAROTID NEUROPRTCT (MISCELLANEOUS) ×2 IMPLANT
TOWEL GREEN STERILE (TOWEL DISPOSABLE) ×3 IMPLANT
TRANSCAROTID NEUROPROTECT SYS (MISCELLANEOUS) ×3
WATER STERILE IRR 1000ML POUR (IV SOLUTION) ×3 IMPLANT
WIRE BENTSON .035X145CM (WIRE) ×3 IMPLANT
WIRE SPARTACORE .014X300CM (WIRE) ×2 IMPLANT

## 2021-11-21 NOTE — Anesthesia Procedure Notes (Signed)
Procedure Name: Intubation Date/Time: 11/21/2021 8:12 AM Performed by: Lance Coon, CRNA Pre-anesthesia Checklist: Patient identified, Emergency Drugs available, Suction available, Patient being monitored and Timeout performed Patient Re-evaluated:Patient Re-evaluated prior to induction Oxygen Delivery Method: Circle system utilized Preoxygenation: Pre-oxygenation with 100% oxygen Induction Type: IV induction Ventilation: Mask ventilation without difficulty Laryngoscope Size: Miller and 3 Grade View: Grade II Tube type: Oral Tube size: 7.0 mm Number of attempts: 1 Airway Equipment and Method: Stylet Placement Confirmation: ETT inserted through vocal cords under direct vision, positive ETCO2 and breath sounds checked- equal and bilateral Secured at: 21 cm Tube secured with: Tape Dental Injury: Teeth and Oropharynx as per pre-operative assessment

## 2021-11-21 NOTE — Op Note (Signed)
Patient name: Wanda Harrington MRN: [id number] DOB: 1951/01/25 Sex: female  11/21/2021 Pre-operative Diagnosis: Asymptomatic right carotid stenosis Post-operative diagnosis:  Same Surgeon:  Annamarie Major Assistants:  Ivin Booty, PA Procedure:   #1: Right carotid stent (TCAR)   #2: Retrograde flow reversal neuro protection   #3: Ultrasound-guided access, left common femoral vein Anesthesia:  General Blood Loss:  minimal Specimens:  none  Findings: 98% stenosis, resolved after stenting Stent: ENROUTE 8 x 30 Flow reversal time: 41 minutes Predilation balloon: 5 x 30 Shockwave balloon 4 x 40 Contrast: 60 cc Dose area 1536.88 Fluoroscopy 17.7 minutes Procedure time: 90 minutes.  Indications: This is a 71 year old female with bilateral high-grade carotid stenosis.  She comes in today to have the right side fixed first.  Procedure:  The patient was identified in the holding area and taken to [place] 16  The patient was then placed supine on the table. general anesthesia was administered.  The patient was prepped and draped in the usual sterile fashion.  A time out was called and antibiotics were administered.  A PA was necessary to expedite the procedure and assist with technical details.  Ultrasound was used to evaluate the left common femoral vein which was widely patent and easily compressible.  The left common femoral vein was then cannulated under ultrasound guidance with a micropuncture needle.  A 018 wire was advanced without resistance.  A micropuncture sheath was placed.  Next, a Bentson wire was inserted followed by placement of the TCAR sheath.  Next, a transverse incision was made just above the clavicle.  The patient was fully heparinized.  Cautery was used divide the platysma muscle.  I identified the 2 heads of the sternocleidomastoid and developed an avascular plane.  I then exposed the common carotid artery.  This was soft.  It was circumferentially mobilized.  On umbilical  tape and a blue vessel loop was placed.  The vagus nerve was visualized and protected.  The PA helped by providing exposure.  Next, a 5-0 Prolene U stitch was placed on the anterior surface of the common carotid artery.  The carotid artery was then cannulated under ultrasound guidance with a micropuncture needle.  A 014 wire was inserted up to the line on the wire and a micropuncture sheath was placed.  Next, a contrast injection was performed locating the bifurcation.  I felt the bifurcation was too low to land short and so I elected to advance the 018 wire into the external carotid artery, followed by placement of the dilator and then insertion of the micropuncture sheath into the external carotid artery.  I then placed the carotid Amplatz up into the external carotid artery.  Next, the TCAR sheath was placed.  Flow reversal tubing was connected and passive flow reversal was initiated which was confirmed with a saline flush.  Next, a TCAR timeout was performed.  Active flow reversal was initiated by tightening the blue vessel loop.  This was confirmed with a saline flush.  A contrast injection was then performed to locate the internal carotid artery.  The lesion was very calcified and had about a 98% stenosis.  I had significant difficulty cannulating the internal carotid artery.  I used a 018 wire as well as a 014 wire and a Vanchee3 catheter to ultimately gain access.  Once this was done, a shockwave 4 x 40 balloon was inserted.  It was inflated to 4 atm, and I performed 3 treatments of seconds each.  I  then predilated the lesion with a 5 x 30 balloon.  Finally, a ENROUTE 8 x 30 stent was inserted and deployed across the lesion.  I waited 2 minutes and then perform multiple completion images which showed complete resolution of the stenosis.  The wire was removed.  Flow reversal was discontinued.  I then removed the TCAR sheath and closed the arteriotomy by securing the previously placed 5-0 Prolene.  Hand-held  Doppler was used to evaluate signals in the carotid artery which were brisk.  At this point, 50 mg of protamine were administered.  The PA remove the sheath in the left groin and held manual pressure for hemostasis.  Once the neck incision was hemostatic, it was irrigated.  The platysma muscle was closed with 3-0 Vicryl followed by subcuticular closure.  Dermabond was applied on the skin.  The patient tolerated the procedure well, was successfully extubated and taken the recovery in stable condition.  She is neurologically intact.   Disposition: To PACU stable.   Theotis Burrow, M.D., [facility] [place] Office: [phone] Pager:  [phone]

## 2021-11-21 NOTE — Progress Notes (Signed)
PHARMACIST LIPID MONITORING   Wanda Harrington is a 71 y.o. female admitted on 11/21/2021 for right sided TCAR for asymptomatic carotid stenosis.  Pharmacy has been consulted to optimize lipid-lowering therapy with the indication of secondary prevention for clinical ASCVD.  Recent Labs:  Lipid Panel (last 6 months):   Lab Results  Component Value Date   CHOL 190 09/08/2021   TRIG 84 09/08/2021   HDL 56 09/08/2021   CHOLHDL 3.4 09/08/2021   LDLCALC 119 (H) 09/08/2021    Hepatic function panel (last 6 months):   Lab Results  Component Value Date   AST 63 (H) 11/17/2021   ALT 61 (H) 11/17/2021   ALKPHOS 61 11/17/2021   BILITOT 0.5 11/17/2021    SCr (since admission):   Serum creatinine: 1.36 mg/dL (H) 11/21/21 1040 Estimated creatinine clearance: 27.4 mL/min (A)  Current therapy and lipid therapy tolerance Current lipid-lowering therapy: Atorvastatin '80mg'$  Previous lipid-lowering therapies (if applicable): pravastatin '20mg'$  Documented or reported allergies or intolerances to lipid-lowering therapies (if applicable): none  Assessment:   71 yo woman with LDL 119 in March 2023 while on pravastatin '20mg'$  daily. Statin was changed to atorvastatin '80mg'$ . Pending repeat LDL levels 5/27. Will consider adding ezetimibe if not at goal < 70.   Plan:    1.Statin intensity (high intensity recommended for all patients regardless of the LDL):  No statin changes. The patient is already on a high intensity statin.  2.Add ezetimibe (if any one of the following):   Not indicated at this time.  3.Refer to lipid clinic:   No  4.Follow-up with:  Primary care provider - Emelia Loron, NP  5.Follow-up labs after discharge:  No changes in lipid therapy, repeat a lipid panel in one year.     Benetta Spar, PharmD, BCPS, BCCP Clinical Pharmacist  Please check AMION for all [place] phone numbers After 10:00 PM, call [place] [phone]

## 2021-11-21 NOTE — Progress Notes (Addendum)
Patient noted to have soft BP's which is new since arrival to unit.  Other vital signs unchanged.  Patient appears to be asymptomatic.  MD and PA paged.  500 NS bolus administered with little change.  MD to bedside and new orders received.

## 2021-11-21 NOTE — Progress Notes (Signed)
Status post right sided TCAR for asymptomatic carotid stenosis Patient seen and evaluated in the PACU.  She is answering questions appropriately.  The right neck incision and left groin site are without hematoma.  She is neurologically intact.  She is answering questions appropriately.  She is stable for transport to the floor.  Case discussed with her friend Manuela Schwartz via telephone.  Wanda Harrington

## 2021-11-21 NOTE — Discharge Instructions (Signed)
[facility]  Discharge Instructions   Carotid Surgery  Please refer to the following instructions for your post-procedure care. Your surgeon or physician assistant will discuss any changes with you.  Activity  You are encouraged to walk as much as you can. You can slowly return to normal activities but must avoid strenuous activity and heavy lifting until your doctor tell you it's okay. Avoid activities such as vacuuming or swinging a golf club. You can drive after one week if you are comfortable and you are no longer taking prescription pain medications. It is normal to feel tired for serval weeks after your surgery. It is also normal to have difficulty with sleep habits, eating, and bowel movements after surgery. These will go away with time.  Bathing/Showering  Shower daily after you go home. Do not soak in a bathtub, hot tub, or swim until the incision heals completely.  Incision Care  Shower every day. Clean your incision with mild soap and water. Pat the area dry with a clean towel. You do not need a bandage unless otherwise instructed. Do not apply any ointments or creams to your incision. You may have skin glue on your incision. Do not peel it off. It will come off on its own in about one week. Your incision may feel thickened and raised for several weeks after your surgery. This is normal and the skin will soften over time.   For Men Only: It's okay to shave around the incision but do not shave the incision itself for 2 weeks. It is common to have numbness under your chin that could last for several months.  Diet  Resume your normal diet. There are no special food restrictions following this procedure. A low fat/low cholesterol diet is recommended for all patients with vascular disease. In order to heal from your surgery, it is CRITICAL to get adequate nutrition. Your body requires vitamins, minerals, and protein. Vegetables are the best source of  vitamins and minerals. Vegetables also provide the perfect balance of protein. Processed food has little nutritional value, so try to avoid this.  Medications  Resume taking all of your medications unless your doctor or physician assistant tells you not to. If your incision is causing pain, you may take over-the- counter pain relievers such as acetaminophen (Tylenol). If you were prescribed a stronger pain medication, please be aware these medications can cause nausea and constipation. Prevent nausea by taking the medication with a snack or meal. Avoid constipation by drinking plenty of fluids and eating foods with a high amount of fiber, such as fruits, vegetables, and grains.   Do not take Tylenol if you are taking prescription pain medications.  STOP TAKING YOUR ZESTORETIC (LISINOPRIL/HCTZ) UNTIL YOU HAVE HAD YOUR LABS CHECKED AND SEEN YOUR PCP NEXT WEEK.  Follow Up  Our office will schedule a follow up appointment 2-3 weeks following discharge.  Please call us immediately for any of the following conditions  Increased pain, redness, drainage (pus) from your incision site. Fever of 101 degrees or higher. If you should develop stroke (slurred speech, difficulty swallowing, weakness on one side of your body, loss of vision) you should call 911 and go to the nearest emergency room.  Reduce your risk of vascular disease:  Stop smoking. If you would like help call [facility] at 1-800-QUIT-NOW ((939)030-1515) or [place] at 818-389-3307. Manage your cholesterol Maintain a desired weight Control your diabetes Keep your blood pressure down  If you have any  questions, please call the office at [phone].

## 2021-11-21 NOTE — Interval H&P Note (Signed)
History and Physical Interval Note:  11/21/2021 7:30 AM  Wanda Harrington  has presented today for surgery, with the diagnosis of Bilateral carotid stenosis.  The various methods of treatment have been discussed with the patient and family. After consideration of risks, benefits and other options for treatment, the patient has consented to  Procedure(s): Right Transcarotid Artery Revascularization (Right) as a surgical intervention.  The patient's history has been reviewed, patient examined, no change in status, stable for surgery.  I have reviewed the patient's chart and labs.  Questions were answered to the patient's satisfaction.     Annamarie Major

## 2021-11-21 NOTE — Transfer of Care (Signed)
Immediate Anesthesia Transfer of Care Note  Patient: Wanda Harrington  Procedure(s) Performed: Right Transcarotid Artery Revascularization (Right: Neck) INTRAVASCULAR SHOCK WAVE LITHOTRIPSY RIGHT CAROTID ARTERY (Right: Neck) ULTRASOUND GUIDANCE FOR VASCULAR ACCESS (Left: Groin)  Patient Location: PACU  Anesthesia Type:General  Level of Consciousness: drowsy and patient cooperative  Airway & Oxygen Therapy: Patient Spontanous Breathing  Post-op Assessment: Report given to RN and Post -op Vital signs reviewed and stable  Post vital signs: Reviewed and stable  Last Vitals:  Vitals Value Taken Time  BP 130/60 11/21/21 1017  Temp    Pulse 88 11/21/21 1019  Resp 10 11/21/21 1019  SpO2 94 % 11/21/21 1019  Vitals shown include unvalidated device data.  Last Pain:  Vitals:   11/21/21 0620  PainSc: 0-No pain         Complications: No notable events documented.

## 2021-11-21 NOTE — Progress Notes (Signed)
PHARMACY NOTE:  ANTIMICROBIAL RENAL DOSAGE ADJUSTMENT  Current antimicrobial regimen includes a mismatch between antimicrobial dosage and estimated renal function.  As per policy approved by the Pharmacy & Therapeutics and Medical Executive Committees, the antimicrobial dosage will be adjusted accordingly.  Current antimicrobial dosage:  cefazolin 2g Q8 hr for surgical prophylaxis and Bactrim for UTA (started PTA)  Renal Function:  Estimated Creatinine Clearance: 27.4 mL/min (A) (by C-G formula based on SCr of 1.36 mg/dL (H)).     Antimicrobial dosage has been changed to:  2g Q24 hr   Additional comments: Discussed bactrim duration and need for renal dose adjustment with team. Given now on peri-op cefazolin, will complete 5 full days of antibiotics. Team agreed with stopping bactrim.    Thank you for allowing pharmacy to be a part of this patient's care.    Benetta Spar, PharmD, BCPS, BCCP Clinical Pharmacist  Please check AMION for all [place] phone numbers After 10:00 PM, call [place] [phone]

## 2021-11-21 NOTE — Anesthesia Procedure Notes (Signed)
Arterial Line Insertion Start/End5/26/2023 7:24 AM, 11/21/2021 7:30 AM Performed by: Oleta Mouse, MD, anesthesiologist  Patient location: Pre-op. Preanesthetic checklist: patient identified, IV checked, site marked, risks and benefits discussed, surgical consent, monitors and equipment checked, pre-op evaluation, timeout performed and anesthesia consent Lidocaine 1% used for infiltration Left, radial was placed Catheter size: 20 G Hand hygiene performed  and maximum sterile barriers used   Attempts: 1 Procedure performed without using ultrasound guided technique. Following insertion, dressing applied and Biopatch. Post procedure assessment: normal and unchanged  Post procedure complications: second provider assisted. Patient tolerated the procedure well with no immediate complications.

## 2021-11-21 NOTE — Progress Notes (Signed)
Patient arrived to 4E09 from PACU after TCAR w/Dr. Trula Slade.  Incision site clean, dry and intact.  Vital signs stable.  Neuro exam WNL. Telemetry monitor applied and CCMD notified.  CHG bath and skin assessment completed.  Patient oriented to unit and room to include call light and phone.  All needs addressed and call light within reach.

## 2021-11-21 NOTE — Anesthesia Postprocedure Evaluation (Signed)
Anesthesia Post Note  Patient: Wanda Harrington  Procedure(s) Performed: Right Transcarotid Artery Revascularization (Right: Neck) INTRAVASCULAR SHOCK WAVE LITHOTRIPSY RIGHT CAROTID ARTERY (Right: Neck) ULTRASOUND GUIDANCE FOR VASCULAR ACCESS (Left: Groin)     Patient location during evaluation: PACU Anesthesia Type: General Level of consciousness: awake and alert Pain management: pain level controlled Vital Signs Assessment: post-procedure vital signs reviewed and stable Respiratory status: spontaneous breathing, nonlabored ventilation, respiratory function stable and patient connected to nasal cannula oxygen Cardiovascular status: blood pressure returned to baseline and stable Postop Assessment: no apparent nausea or vomiting Anesthetic complications: no   No notable events documented.  Last Vitals:  Vitals:   11/21/21 1400 11/21/21 1500  BP: (!) 109/53 109/68  Pulse:    Resp: 19 13  Temp:    SpO2:      Last Pain:  Vitals:   11/21/21 1300  TempSrc: Axillary  PainSc:                  [name]

## 2021-11-22 LAB — BASIC METABOLIC PANEL
Anion gap: 6 (ref 5–15)
BUN: 25 mg/dL — ABNORMAL HIGH (ref 8–23)
CO2: 23 mmol/L (ref 22–32)
Calcium: 9 mg/dL (ref 8.9–10.3)
Chloride: 104 mmol/L (ref 98–111)
Creatinine, Ser: 1.69 mg/dL — ABNORMAL HIGH (ref 0.44–1.00)
GFR, Estimated: 32 mL/min — ABNORMAL LOW (ref >60)
Glucose, Bld: 130 mg/dL — ABNORMAL HIGH (ref 70–99)
Potassium: 5.2 mmol/L — ABNORMAL HIGH (ref 3.5–5.1)
Sodium: 133 mmol/L — ABNORMAL LOW (ref 135–145)

## 2021-11-22 LAB — LIPID PANEL
Cholesterol: 131 mg/dL (ref 0–200)
HDL: 51 mg/dL (ref >40)
LDL Cholesterol: 75 mg/dL (ref 0–99)
Total CHOL/HDL Ratio: 2.6 RATIO
Triglycerides: 23 mg/dL (ref <150)
VLDL: 5 mg/dL (ref 0–40)

## 2021-11-22 LAB — CBC
HCT: 33.9 % — ABNORMAL LOW (ref 36.0–46.0)
Hemoglobin: 11.3 g/dL — ABNORMAL LOW (ref 12.0–15.0)
MCH: 29.9 pg (ref 26.0–34.0)
MCHC: 33.3 g/dL (ref 30.0–36.0)
MCV: 89.7 fL (ref 80.0–100.0)
Platelets: 182 10*3/uL (ref 150–400)
RBC: 3.78 MIL/uL — ABNORMAL LOW (ref 3.87–5.11)
RDW: 13.2 % (ref 11.5–15.5)
WBC: 9.1 10*3/uL (ref 4.0–10.5)
nRBC: 0 % (ref 0.0–0.2)

## 2021-11-22 MED ORDER — EZETIMIBE 10 MG PO TABS
10.0000 mg | ORAL_TABLET | Freq: Every day | ORAL | Status: DC
  Administered 2021-11-22 – 2021-11-23 (×2): 10 mg via ORAL
  Filled 2021-11-22 (×2): qty 1

## 2021-11-22 MED ORDER — SODIUM CHLORIDE 0.9 % IV SOLN: 250.0000 mL | INTRAVENOUS | Status: DC | PRN

## 2021-11-22 NOTE — Progress Notes (Signed)
PHARMACIST LIPID MONITORING   Wanda Harrington is a 71 y.o. female admitted on 11/21/2021 with carotid disease.  Pharmacy has been consulted to optimize lipid-lowering therapy with the indication of secondary prevention for clinical ASCVD.  Recent Labs:  Lipid Panel (last 6 months):   Lab Results  Component Value Date   CHOL 131 11/22/2021   TRIG 23 11/22/2021   HDL 51 11/22/2021   CHOLHDL 2.6 11/22/2021   VLDL 5 11/22/2021   LDLCALC 75 11/22/2021    Hepatic function panel (last 6 months):   Lab Results  Component Value Date   AST 63 (H) 11/17/2021   ALT 61 (H) 11/17/2021   ALKPHOS 61 11/17/2021   BILITOT 0.5 11/17/2021    SCr (since admission):   Serum creatinine: 1.69 mg/dL (H) 11/22/21 0440 Estimated creatinine clearance: 22.1 mL/min (A)  Current therapy and lipid therapy tolerance Current lipid-lowering therapy: Atorvastatin 80 Previous lipid-lowering therapies (if applicable): na Documented or reported allergies or intolerances to lipid-lowering therapies (if applicable): well tolerated  Assessment:   Patient agrees with changes to lipid-lowering therapy  Plan:    1.Statin intensity (high intensity recommended for all patients regardless of the LDL):  No statin changes. The patient is already on a high intensity statin.  2.Add ezetimibe (if any one of the following):   On a high intensity statin with LDL > 70.  3.Refer to lipid clinic:   No  4.Follow-up with:  Primary care provider - Emelia Loron, NP  5.Follow-up labs after discharge:  Changes in lipid therapy were made. Check a lipid panel in 8-12 weeks then annually.      Thank you for allowing pharmacy to participate in this patient's care.  Reatha Harps, PharmD PGY1 Pharmacy Resident 11/22/2021 8:53 AM Check [email or web address] for unit specific pharmacy number

## 2021-11-22 NOTE — Progress Notes (Addendum)
  Progress Note    11/22/2021 6:56 AM 1 Day Post-Op  Subjective:  says she's been peeing all night and can't sleep bc of it. No trouble swallowing.  Afebrile HR 60's-90's 094'B-096'G systolic 83% RA  Dopamine off this am at 0400  Vitals:   11/22/21 0400 11/22/21 0500  BP: 121/63   Pulse:    Resp: 18 17  Temp:    SpO2:       Physical Exam: Neuro:  in tact; tongue is midline; moving all extremities equally Lungs:  non labored Incision:  right neck incision looks good.  Left groin is soft without hematoma  CBC    Component Value Date/Time   WBC 9.1 11/22/2021 0440   RBC 3.78 (L) 11/22/2021 0440   HGB 11.3 (L) 11/22/2021 0440   HCT 33.9 (L) 11/22/2021 0440   PLT 182 11/22/2021 0440   MCV 89.7 11/22/2021 0440   MCH 29.9 11/22/2021 0440   MCHC 33.3 11/22/2021 0440   RDW 13.2 11/22/2021 0440   LYMPHSABS 2.4 08/04/2021 1432   MONOABS 0.6 08/04/2021 1432   EOSABS 0.2 08/04/2021 1432   BASOSABS 0.1 08/04/2021 1432    BMET    Component Value Date/Time   NA 133 (L) 11/22/2021 0440   NA 139 07/03/2021 1607   K 5.2 (H) 11/22/2021 0440   CL 104 11/22/2021 0440   CO2 23 11/22/2021 0440   GLUCOSE 130 (H) 11/22/2021 0440   BUN 25 (H) 11/22/2021 0440   BUN 22 07/03/2021 1607   CREATININE 1.69 (H) 11/22/2021 0440   CALCIUM 9.0 11/22/2021 0440   GFRNONAA 32 (L) 11/22/2021 0440     Intake/Output Summary (Last 24 hours) at 11/22/2021 0656 Last data filed at 11/22/2021 0549 Gross per 24 hour  Intake 1784.39 ml  Output 670 ml  Net 1114.39 ml     Assessment/Plan:  This is a 71 y.o. female who is s/p right TCAR 1 Day Post-Op   -pt is doing well this am.  Her creatinine is elevated to 1.69 from 1.18 pre op.  It was 1.39 post op day of surgery.  Her K+ is 5.2 today.  She has been voiding well.  Most likely needs another day with IVF and check labs in am.  Zestoretic has been discontinued.   -pt neuro exam is in tact -pt has voided -continue asa/statin/plavix -f/u with  [facility] in 4 weeks with carotid duplex.    Leontine Locket, PA-C [facility] [phone]  I have independently interviewed and examined patient and agree with PA assessment and plan above.  Plan will be for continued hydration given elevated creatinine likely secondary to hypotension yesterday.  She is off dopamine, we will continue Sudafed while she is admitted.  Okay for diet and activity as tolerated.  Carynn Felling C. Donzetta Matters, MD [facility] [place] Office: [phone] Pager: [phone]

## 2021-11-23 LAB — BASIC METABOLIC PANEL
Anion gap: 5 (ref 5–15)
BUN: 25 mg/dL — ABNORMAL HIGH (ref 8–23)
CO2: 24 mmol/L (ref 22–32)
Calcium: 9.1 mg/dL (ref 8.9–10.3)
Chloride: 104 mmol/L (ref 98–111)
Creatinine, Ser: 1.38 mg/dL — ABNORMAL HIGH (ref 0.44–1.00)
GFR, Estimated: 41 mL/min — ABNORMAL LOW (ref >60)
Glucose, Bld: 103 mg/dL — ABNORMAL HIGH (ref 70–99)
Potassium: 5 mmol/L (ref 3.5–5.1)
Sodium: 133 mmol/L — ABNORMAL LOW (ref 135–145)

## 2021-11-23 MED ORDER — EZETIMIBE 10 MG PO TABS: 10.0000 mg | ORAL_TABLET | Freq: Every day | ORAL | 4 refills | Status: AC

## 2021-11-23 MED ORDER — OXYCODONE-ACETAMINOPHEN 5-325 MG PO TABS: 1.0000 | ORAL_TABLET | Freq: Four times a day (QID) | ORAL | 0 refills | Status: AC | PRN

## 2021-11-23 MED ORDER — ONDANSETRON HCL 4 MG PO TABS: 4.0000 mg | ORAL_TABLET | Freq: Three times a day (TID) | ORAL | 0 refills | Status: AC | PRN

## 2021-11-23 MED ORDER — SULFAMETHOXAZOLE-TRIMETHOPRIM 800-160 MG PO TABS: 1.0000 | ORAL_TABLET | Freq: Two times a day (BID) | ORAL | 0 refills | Status: DC

## 2021-11-23 NOTE — Progress Notes (Signed)
Pt discharged with housemate/friend  Helped pt get on the [place] bus. Pt made it on to the bus safely

## 2021-11-23 NOTE — Discharge Summary (Addendum)
Discharge Summary     Chirstina Haan 02-08-1951 71 y.o. female  [id number]  Admission Date: 11/21/2021  Discharge Date: 11/23/2021  Physician: Serafina Mitchell, MD  Admission Diagnosis: Carotid stenosis [I65.29] Carotid stenosis, asymptomatic, right [I65.21]   HPI:   This is a 71 y.o. female who is referred for evaluation of bilateral carotid stenosis.  In June 2021 she developed left hand weakness.  Over time, this progressed to where she cannot raise her arm over her head.  She has had difficulty with use of her left hand.  She has overall generalized weakness especially in the left leg.  She was seen by neurology in [place].  At that time it was reported that she had left arm focal neuropathy.  She was referred to orthopedic surgery for potential decompression.  In February 2023 she had an MRI that showed a right frontal posterior cortical ischemic infarct.  Carotid duplex showed bilateral carotid stenosis.  She had a TEE that was normal.  I sent her for a CT scan to better define her anatomy.  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  The patient is medically managed for hypertension.  She is on a statin for hypercholesterolemia.  She continues to smoke.  She also complains of leg pain with walking.  She does not have any open wounds.  Hospital Course:  The patient was admitted to the hospital and taken to the operating room on 11/21/2021 and underwent right TCAR    Findings: 98% stenosis, resolved after stenting Stent: ENROUTE 8 x 30 Flow reversal time: 41 minutes Predilation balloon: 5 x 30 Shockwave balloon 4 x 40 Contrast: 60 cc Dose area 1536.88 Fluoroscopy 17.7 minutes Procedure time: 90 minutes.  The pt tolerated the procedure well and was transported to the PACU in good condition. She did have some hypotension and was started on dopamine and pseudoephedrine.    By POD 1, the pt neuro status was in tact  and she was doing well.  She was able to be weaned off of the dopamine.  She did have an increase in her creatinine to 1.69 from 1.36 after surgery and 1.18 prior to surgery.  She was started on IVF and her ace inhibitor and diuretic was not restarted.  POD 2, her creatinine had improved to 1.38.  she was not restarted on her Zestoretic.  She was neuro in tact.  She did have some soreness/fullness of the right neck with ecchymosis.  No evidence of infection and incision looks good.  She is discharged home and to f/u with her PCP this week to recheck her kidney function and evaluate BP medications.     Recent Labs    11/22/21 0440 11/23/21 0229  NA 133* 133*  K 5.2* 5.0  CL 104 104  CO2 23 24  GLUCOSE 130* 103*  BUN 25* 25*  CALCIUM 9.0 9.1   Recent Labs    11/21/21 1040 11/22/21 0440  WBC 4.6 9.1  HGB 11.4* 11.3*  HCT 35.5* 33.9*  PLT 162 182   No results for input(s): INR in the last 72 hours.     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, estrogen receptor positive ([place]) 07/28/2021   Left hand weakness 07/03/2021   Carpal tunnel syndrome of left wrist 03/14/2021   Pain of left hand 03/14/2021   Past Medical History:  Diagnosis Date   Anxiety    BP (high blood pressure)    Cancer (HCC)    Right breast, pt had R breast mastectomy including lymph node removal   Depression    Hepatitis    Hep C, pt states she has not had treatment   High cholesterol    Neuropathy    Stroke ([place]) 2021    Allergies as of 11/23/2021       Reactions   Motrin  [ibuprofen] Hives, Other (See Comments)   Pt says her feel "tingly headed" and causes hives   Other Nausea And Vomiting   Opioid pain meds    Aleve [naproxen] Rash   Latex Rash        Medication List     STOP taking these medications    lisinopril-hydrochlorothiazide 10-12.5 MG tablet Commonly known as: ZESTORETIC   sulfamethoxazole-trimethoprim 800-160 MG tablet Commonly known as: BACTRIM DS       TAKE these medications    ALPRAZolam 0.25 MG tablet Commonly known as: XANAX Take 0.25 mg by mouth 2 (two) times daily as needed for anxiety.   aluminum hydroxide-magnesium carbonate 95-358 MG/15ML Susp Commonly known as: GAVISCON Take 15 mLs by mouth as needed.   anastrozole 1 MG tablet Commonly known as: ARIMIDEX Take 1 tablet (1 mg total) by mouth daily.   aspirin EC 81 MG tablet Take 81 mg by mouth daily. Swallow whole.   atorvastatin 80 MG tablet Commonly known as: LIPITOR Take 1 tablet (80 mg total) by mouth daily.   cholecalciferol 25 MCG (1000 UNIT) tablet Commonly known as: VITAMIN D3 Take 1,000 Units by mouth daily.   clopidogrel 75 MG tablet Commonly known as: PLAVIX Take 1 tablet (75 mg total) by mouth daily.   ezetimibe 10 MG tablet Commonly known as: ZETIA Take 1 tablet (10 mg total) by mouth daily.   fluticasone 50 MCG/ACT nasal spray Commonly known as: FLONASE Place 1 spray into both nostrils 2 (two) times daily as needed for allergies.   loratadine 10 MG tablet Commonly known as: CLARITIN Take 10 mg by mouth daily as needed for allergies.   ondansetron 4 MG tablet Commonly known as: Zofran Take 1 tablet (4 mg total) by mouth every 8 (eight) hours as needed for nausea or vomiting.   OVER THE COUNTER MEDICATION Smoke Medical CBD couple time a day   oxyCODONE-acetaminophen 5-325 MG tablet Commonly known as: Percocet Take 1 tablet by mouth every 6 (six) hours as needed for severe pain.   polyethylene glycol 17 g packet Commonly known  as: MIRALAX / GLYCOLAX Take 17 g by mouth daily as needed for moderate constipation.   pregabalin 50 MG capsule Commonly known as: LYRICA Take 50 mg by mouth 2 (two) times daily.         [facility] Discharge Instructions Carotid Endarterectomy (CEA)  Please refer to the following instructions for your post-procedure care. Your surgeon or physician assistant will discuss any changes with you.  Activity  You are encouraged to walk as much as you can. You can slowly return to normal activities but must avoid strenuous activity and heavy lifting until your doctor tell you it's OK. Avoid activities such as vacuuming  or swinging a golf club. You can drive after one week if you are comfortable and you are no longer taking prescription pain medications. It is normal to feel tired for serval weeks after your surgery. It is also normal to have difficulty with sleep habits, eating, and bowel movements after surgery. These will go away with time.  Bathing/Showering  You may shower after you come home. Do not soak in a bathtub, hot tub, or swim until the incision heals completely.  Incision Care  Shower every day. Clean your incision with mild soap and water. Pat the area dry with a clean towel. You do not need a bandage unless otherwise instructed. Do not apply any ointments or creams to your incision. You may have skin glue on your incision. Do not peel it off. It will come off on its own in about one week. Your incision may feel thickened and raised for several weeks after your surgery. This is normal and the skin will soften over time. For Men Only: It's OK to shave around the incision but do not shave the incision itself for 2 weeks. It is common to have numbness under your chin that could last for several months.  Diet  Resume your normal diet. There are no special food restrictions following this procedure. A low fat/low cholesterol diet is recommended for  all patients with vascular disease. In order to heal from your surgery, it is CRITICAL to get adequate nutrition. Your body requires vitamins, minerals, and protein. Vegetables are the best source of vitamins and minerals. Vegetables also provide the perfect balance of protein. Processed food has little nutritional value, so try to avoid this.  Medications  Resume taking all of your medications unless your doctor or physician assistant tells you not to.  If your incision is causing pain, you may take over-the- counter pain relievers such as acetaminophen (Tylenol). If you were prescribed a stronger pain medication, please be aware these medications can cause nausea and constipation.  Prevent nausea by taking the medication with a snack or meal. Avoid constipation by drinking plenty of fluids and eating foods with a high amount of fiber, such as fruits, vegetables, and grains.  Do not take Tylenol if you are taking prescription pain medications.  Follow Up  Our office will schedule a follow up appointment 2-3 weeks following discharge.  Please call us immediately for any of the following conditions  Increased pain, redness, drainage (pus) from your incision site. Fever of 101 degrees or higher. If you should develop stroke (slurred speech, difficulty swallowing, weakness on one side of your body, loss of vision) you should call 911 and go to the nearest emergency room.  Reduce your risk of vascular disease:  Stop smoking. If you would like help call [facility] at 1-800-QUIT-NOW (317)855-1971) or [place] at 561-800-1797. Manage your cholesterol Maintain a desired weight Control your diabetes Keep your blood pressure down  If you have any questions, please call the office at [phone].  Prescriptions given: 1.   Roxicet #8 No Refill 2.  Zofran 4 mg #10 no refill 3.  Zetia '10mg'$  daily #90 four refills Disposition: home  Patient's condition: is Good  Follow up: 1. [facility] in 4  weeks with carotid duplex   Leontine Locket, PA-C [facility] [phone]   --- For [facility] Registry use ---   Modified Rankin score at D/C (0-6): 0  IV medication needed for:  1. Hypertension: No 2. Hypotension: Yes  Post-op Complications: No  If yes: Event classification (right eye, left eye, right cortical, left cortical, verterobasilar, other): n/a  If yes: Timing of event (intra-op, <6 hrs post-op, >=6 hrs post-op, unknown): n/a  2. CN injury: No  If yes: CN n/a injuried   3. Myocardial infarction: No  If yes: Dx by (EKG or clinical, Troponin): n/a  4.  CHF: No  5.  Dysrhythmia (new): No  6. Wound infection: No  7. Reperfusion symptoms: No  8. Return to OR: No  If yes: return to OR for (bleeding, neurologic, other CEA incision, other): n/a  Discharge medications: Statin use:  Yes ASA use:  Yes   Beta blocker use:  No ACE-Inhibitor use:  No Discontinued at dc ARB use:  No CCB use: No P2Y12 Antagonist use: Yes, [ x] Plavix, '[ ]'$  Plasugrel, '[ ]'$  Ticlopinine, '[ ]'$  Ticagrelor, '[ ]'$  Other, '[ ]'$  No for medical reason, '[ ]'$  Non-compliant, '[ ]'$  Not-indicated Anti-coagulant use:  No, '[ ]'$  Warfarin, '[ ]'$  Rivaroxaban, '[ ]'$  Dabigatran,

## 2021-11-23 NOTE — Progress Notes (Addendum)
  Progress Note    11/23/2021 7:02 AM 2 Days Post-Op  Subjective:  says she has been peeing all night again.  Says her neck is sore and feels like it is swollen.   Afebrile HR 50's-80's NSR 846'N-629'B systolic 28% RA  Vitals:   11/23/21 0019 11/23/21 0447  BP: (!) 151/74 136/72  Pulse:    Resp: 16 16  Temp: (!) 97.4 F (36.3 C) (!) 97.5 F (36.4 C)  SpO2: 92% 90%    Physical Exam: Cardiac:  regular Lungs:  non labored Incisions:  clean and dry with ecchymosis.  Mild fullness in right neck but soft Extremities:  moving all extremities equally   CBC    Component Value Date/Time   WBC 9.1 11/22/2021 0440   RBC 3.78 (L) 11/22/2021 0440   HGB 11.3 (L) 11/22/2021 0440   HCT 33.9 (L) 11/22/2021 0440   PLT 182 11/22/2021 0440   MCV 89.7 11/22/2021 0440   MCH 29.9 11/22/2021 0440   MCHC 33.3 11/22/2021 0440   RDW 13.2 11/22/2021 0440   LYMPHSABS 2.4 08/04/2021 1432   MONOABS 0.6 08/04/2021 1432   EOSABS 0.2 08/04/2021 1432   BASOSABS 0.1 08/04/2021 1432    BMET    Component Value Date/Time   NA 133 (L) 11/23/2021 0229   NA 139 07/03/2021 1607   K 5.0 11/23/2021 0229   CL 104 11/23/2021 0229   CO2 24 11/23/2021 0229   GLUCOSE 103 (H) 11/23/2021 0229   BUN 25 (H) 11/23/2021 0229   BUN 22 07/03/2021 1607   CREATININE 1.38 (H) 11/23/2021 0229   CALCIUM 9.1 11/23/2021 0229   GFRNONAA 41 (L) 11/23/2021 0229    INR    Component Value Date/Time   INR 1.0 11/17/2021 1430     Intake/Output Summary (Last 24 hours) at 11/23/2021 4132 Last data filed at 11/22/2021 1300 Gross per 24 hour  Intake 360 ml  Output 200 ml  Net 160 ml     Assessment/Plan:  72 y.o. female is s/p:  Right TCAR  2 Days Post-Op   -pt doing well this morning and neuro in tact. -creatinine improved to 1.38 this morning.  Will hold her lisinopril/HCTZ.  Will have her go to her PCP this week and have her renal function checked again and evaluate her BP medications.   -Will discharge  home today.  She will f/u in 4 weeks with a carotid duplex.   -zetia has been added to her medication regimen.     Leontine Locket, PA-C [facility] [phone] 11/23/2021 7:02 AM  I have independently interviewed and examined patient and agree with PA assessment and plan above.  Pressure has normalized and creatinine has returned to baseline for this hospitalization.  Medication adjustments as above.  Sudafed can be discontinued and she can be discharged today.  Kayin Osment C. Donzetta Matters, MD [facility] [place] Office: [phone] Pager: [phone]

## 2021-11-26 ENCOUNTER — Encounter (HOSPITAL_COMMUNITY): Payer: Self-pay | Admitting: Surgery

## 2021-12-11 ENCOUNTER — Telehealth: Payer: Self-pay

## 2021-12-11 NOTE — Telephone Encounter (Signed)
Pt's friend, Concha Pyo, called stating that the pt was experiencing some UTI symptoms and wanted a refill on her abx.  Returned call, two identifiers used. Explained to pt that she would need to seek care with her PCP or urgent care where they could check her urine to see what, if anything, needed to be done. Confirmed understanding.

## 2021-12-15 ENCOUNTER — Other Ambulatory Visit: Payer: Self-pay | Admitting: *Deleted

## 2021-12-15 DIAGNOSIS — I6523 Occlusion and stenosis of bilateral carotid arteries: Secondary | ICD-10-CM

## 2021-12-22 ENCOUNTER — Encounter: Payer: Self-pay | Admitting: Surgery

## 2021-12-22 ENCOUNTER — Ambulatory Visit (INDEPENDENT_AMBULATORY_CARE_PROVIDER_SITE_OTHER): Payer: Medicare Other | Admitting: Surgery

## 2021-12-22 ENCOUNTER — Ambulatory Visit (HOSPITAL_COMMUNITY)
Admission: RE | Admit: 2021-12-22 | Discharge: 2021-12-22 | Disposition: A | Discharge status: Home / Self Care | Payer: Medicare Other | Source: Ambulatory Visit | Attending: Surgery | Admitting: Surgery

## 2021-12-22 VITALS — BP 151/79 | HR 80 | Temp 97.8°F | Resp 14 | Ht 60.0 in | Wt 96.0 lb

## 2021-12-22 DIAGNOSIS — I6523 Occlusion and stenosis of bilateral carotid arteries: Secondary | ICD-10-CM

## 2022-01-07 ENCOUNTER — Ambulatory Visit (INDEPENDENT_AMBULATORY_CARE_PROVIDER_SITE_OTHER): Payer: Medicare Other | Admitting: Adult Health

## 2022-01-07 ENCOUNTER — Encounter: Payer: Self-pay | Admitting: Adult Health

## 2022-01-07 VITALS — BP 113/74 | HR 89 | Ht 61.0 in | Wt 98.4 lb

## 2022-01-07 DIAGNOSIS — I63311 Cerebral infarction due to thrombosis of right middle cerebral artery: Secondary | ICD-10-CM

## 2022-01-07 DIAGNOSIS — I6523 Occlusion and stenosis of bilateral carotid arteries: Secondary | ICD-10-CM | POA: Diagnosis not present

## 2022-01-07 NOTE — Patient Instructions (Signed)
Continue to follow with vascular surgery for carotid narrowing  Very important for complete tobacco cessation as continued use greatly increases risk of additional strokes and further narrowing or possible renarrowing of the arteries  Continue aspirin 81 mg daily and clopidogrel 75 mg daily  and atorvastatin for secondary stroke prevention  Continue to follow up with PCP regarding cholesterol and blood pressure management  Maintain strict control of hypertension with blood pressure goal below 130/90 and cholesterol with LDL cholesterol (bad cholesterol) goal below 70 mg/dL.   Signs of a Stroke? Follow the BEFAST method:  Balance Watch for a sudden loss of balance, trouble with coordination or vertigo Eyes Is there a sudden loss of vision in one or both eyes? Or double vision?  Face: Ask the person to smile. Does one side of the face droop or is it numb?  Arms: Ask the person to raise both arms. Does one arm drift downward? Is there weakness or numbness of a leg? Speech: Ask the person to repeat a simple phrase. Does the speech sound slurred/strange? Is the person confused ? Time: If you observe any of these signs, call 911.       Thank you for coming to see [place] at [facility]. I hope we have been able to provide you high quality care today.  You may receive a patient satisfaction survey over the next few weeks. We would appreciate your feedback and comments so that we may continue to improve ourselves and the health of our patients.

## 2022-01-07 NOTE — Progress Notes (Signed)
[facility] [address] [place]. [place] [address] [phone]       OFFICE FOLLOW UP NOTE  Ms. Wanda Harrington Date of Birth:  Nov 06, 1950 Medical Record Number:  [id number]    Primary neurologist: Dr. Krista Blue Reason for visit: Progressive left hand weakness    SUBJECTIVE:   CHIEF COMPLAINT:  Chief Complaint  Patient presents with   Follow-up    Pt reports she is doing okay. She recently had surgery. Room 2 with roommate.    HPI:    Update 01/07/2022 JM: patient returns for stroke follow up after prior visit 4 months ago.  She is accompanied by her friend Manuela Schwartz. Since prior visit, underwent R TCAR 5/26 by Dr. Trula Slade without complication and plans on proceeding with left carotid intervention in August. She does report some increased left hand weakness/stiffness since procedure due to blood draws, A-line and pain (from lines). She reports extensive bruising on left forearm from procedure and still some tenderness in that area. She is interested in doing some OT once her left carotid procedure is completed. She does still have some gait impairment with imbalance.  Previously referred for PT/OT but she placed these on hold in order to complete the above procedures.  She has remained on DAPT postprocedure as well as atorvastatin without side effects. Blood pressure today 113/74. Continued tobacco use < 1PPD, has f/u next week with PCP and plans on discussing use of patches as she does want to quit and knows the importance of quitting.  No further concerns at this time.      History provided for reference purposes only Update 09/08/2021 JM: 71 year old female who returns for follow-up visit regarding left hand weakness after prior initial consult visit with Dr. Krista Blue 2 months ago.  She is accompanied by her friend, Manuela Schwartz.  Extensive lab work completed after prior visit which was largely unremarkable.  MR cervical showed mild degenerative changes but otherwise unremarkable.   MRI brain showed chronic infarct in right frontal lobe.  Recommend initiating aspirin 81 mg daily and completed further stroke work-up including 2D echo with EF 55 to 60% and carotid ultrasound which showed right ICA 80 to 99% stenosis with ECA > 50% stenosis, left ICA 80 to 99% stenosis with ECA <50% stenosis, L VA antegrade flow, R VA occlusion and normal flow within the subclavian arteries bilaterally.  She was referred to vascular surgery but was a "no-show" for initial consult visit on 2/27 - per patient and friend, they were not aware of this appt being scheduled and did not receive any call.  Reports continued left arm weakness and tightness sensation. No new neurological or stroke type symptoms.  Compliant on aspirin 81 mg daily, denies side effects. Also on pravastatin which she reports she has been on for "many years", unsure when last cholesterol level was. Blood pressure today 109/58, does not routinely monitor at home.  Continued tobacco use but trying to decrease amt, currently at <1 PPD. Reports difficulty quitting smoking due to increased stressors.  No further concerns at this time.   HISTORY (copied from Dr. Rhea Belton initial consult visit on 07/03/2021) Wanda Harrington is a 71 year old right-handed female, seen in request by orthopedic surgeon Dr. Caralyn Guile, [facility] Macho, for evaluation of left leg weakness, her primary care physician is nurse practitioner Emelia Loron, she is accompanied by her longtime friend Larae Grooms at today's visit July 03, 2021   I reviewed and summarized the referring note. PMHX. HLD Right breast cancer in 2016, s/p  lobectomy, chemo-radiation therapy, developed chemo induced peripheral neuropathy Longtime smoker   She moved from [place] to [place] in August 2021, around June 2021, while she was still at [facility], watching TV, noticed sudden onset left index and thumb weakness, no sensory change, over the next 24 hours, she developed left hand  weakness, over the past couple years, she continued to noticed increased weakness of her left hand and arm, spreading to left elbow, left shoulder, now it is difficulty for her to raise left arm overhead,  She lives with her friend, whom reported she has increased difficulty, could no longer use left hand, patient also reported generalized weakness, especially left leg, noticed some gait abnormality  She was seen by neurologist at [facility], reported left arm focal neuropathy, who referred her to orthopedic surgeon for potential decompression surgery, due to transfer of insurance reasons, she was only able to see orthopedic surgery in September 2022  I personally reviewed EMG nerve conduction study by Dr. Nelva Bush on March 06, 2021, left median sensory response showed slightly prolonged peak latency 4.4, which was also noted at left ulnar sensory response, with low normal range snap amplitude.  Left median and ulnar motor response all demonstrate mildly prolonged distal latency, well-preserved CMAP amplitude, within normal range conduction velocity.  Selected EMG of left upper extremity muscles including left first dorsal interossei, abductor pollicis brevis, biceps, triceps, deltoid was normal  Patient denies left hand sensory changes, continued complaints bilateral toes in the fingertips paresthesia this happened following her chemotherapy for breast cancer few years back, she denies bowel or bladder incontinence     PERTINENT IMAGING  MR BRAIN W WO CONTRAST 08/06/2021 MRI brain (with and without) demonstrating: -Chronic right posterior frontal cortical ischemic infarction along the primary motor strip. -Right internal carotid artery flow void signal abnormality may be related to proximal stenosis or occlusion. -Mild chronic small vessel ischemic disease. -No acute findings.  MR CERVICAL SPINE 08/06/2021 IMPRESSION:  MRI cervical spine (with and without) demonstrating: - At C5-6  uncovertebral joint hypertrophy at moderate bilateral foraminal stenosis. - At C3-4 disc bulging and uncovertebral joint hypertrophy with mild bilateral foraminal stenosis.  VAS CAROTID DUPLEX 08/20/2021 Summary:  Right Carotid: Velocities in the right ICA are consistent with a 80-99% stenosis. The ECA appears >50% stenosed.  Left Carotid: Velocities in the left ICA are consistent with a 80-99% stenosis. The ECA appears <50% stenosed.  Vertebrals:  Left vertebral artery demonstrates antegrade flow. Right vertebral artery demonstrates an occlusion.  Subclavians: Normal flow hemodynamics were seen in bilateral subclavian arteries.    TTE 08/20/2021 IMPRESSIONS  1. Left ventricular ejection fraction, by estimation, is 55 to 60%. The  left ventricle has normal function. The left ventricle has no regional  wall motion abnormalities. Left ventricular diastolic parameters were  normal.   2. Right ventricular systolic function is normal. The right ventricular  size is normal.   3. The mitral valve is normal in structure. No evidence of mitral valve  regurgitation.   4. The aortic valve is tricuspid. Aortic valve regurgitation is not  visualized. Aortic valve sclerosis is present, with no evidence of aortic  valve stenosis.        ROS:   14 system review of systems performed and negative with exception of those listed in HPI  PMH:  Past Medical History:  Diagnosis Date   Anxiety    BP (high blood pressure)    Cancer (HCC)    Right breast, pt  had R breast mastectomy including lymph node removal   Depression    Hepatitis    Hep C, pt states she has not had treatment   High cholesterol    Neuropathy    Stroke ([place]) 2021    PSH:  Past Surgical History:  Procedure Laterality Date   breast cancer Right 2016   EXTRACORPOREAL SHOCK WAVE LITHOTRIPSY Right 11/21/2021   Procedure: INTRAVASCULAR SHOCK WAVE LITHOTRIPSY RIGHT CAROTID ARTERY;  Surgeon: Serafina Mitchell, MD;  Location: [facility]  OR;  Service: Vascular;  Laterality: Right;   MASTECTOMY Right 2016   including lymph node removal   PORTACATH PLACEMENT  2016   TRANSCAROTID ARTERY REVASCULARIZATION  Right 11/21/2021   Procedure: Right Transcarotid Artery Revascularization;  Surgeon: Serafina Mitchell, MD;  Location: [facility] OR;  Service: Vascular;  Laterality: Right;   ULTRASOUND GUIDANCE FOR VASCULAR ACCESS Left 11/21/2021   Procedure: ULTRASOUND GUIDANCE FOR VASCULAR ACCESS;  Surgeon: Serafina Mitchell, MD;  Location: [facility] OR;  Service: Vascular;  Laterality: Left;    Social History:  Social History   Socioeconomic History   Marital status: Widowed    Spouse name: Not on file   Number of children: 0   Years of education: Not on file   Highest education level: Not on file  Occupational History   Not on file  Tobacco Use   Smoking status: Every Day    Packs/day: 1.00    Types: Cigarettes   Smokeless tobacco: Never  Vaping Use   Vaping Use: Never used  Substance and Sexual Activity   Alcohol use: Yes    Comment: pt may have one drink once a month   Drug use: Yes    Types: Marijuana    Comment: every day   Sexual activity: Not on file  Other Topics Concern   Not on file  Social History Narrative   Not on file   Social Determinants of Health   Financial Resource Strain: Not on file  Food Insecurity: Not on file  Transportation Needs: Not on file  Physical Activity: Not on file  Stress: Not on file  Social Connections: Not on file  Intimate Partner Violence: Not on file    Family History:  Family History  Adopted: Yes  Family history unknown: Yes    Medications:   Current Outpatient Medications on File Prior to Visit  Medication Sig Dispense Refill   ALPRAZolam (XANAX) 0.25 MG tablet Take 0.25 mg by mouth 2 (two) times daily as needed for anxiety.     aluminum hydroxide-magnesium carbonate (GAVISCON) 95-358 MG/15ML SUSP Take 15 mLs by mouth as needed.     anastrozole (ARIMIDEX) 1 MG tablet Take 1  tablet (1 mg total) by mouth daily. 30 tablet 3   aspirin EC 81 MG tablet Take 81 mg by mouth daily. Swallow whole.     atorvastatin (LIPITOR) 80 MG tablet Take 1 tablet (80 mg total) by mouth daily. 90 tablet 3   clopidogrel (PLAVIX) 75 MG tablet Take 1 tablet (75 mg total) by mouth daily. 30 tablet 6   ezetimibe (ZETIA) 10 MG tablet Take 1 tablet (10 mg total) by mouth daily. 90 tablet 4   fluticasone (FLONASE) 50 MCG/ACT nasal spray Place 1 spray into both nostrils 2 (two) times daily as needed for allergies.     loratadine (CLARITIN) 10 MG tablet Take 10 mg by mouth daily as needed for allergies.     polyethylene glycol (MIRALAX / GLYCOLAX) 17 g packet Take 17  g by mouth daily as needed for moderate constipation.     pregabalin (LYRICA) 50 MG capsule Take 50 mg by mouth 2 (two) times daily.     cholecalciferol (VITAMIN D3) 25 MCG (1000 UNIT) tablet Take 1,000 Units by mouth daily. (Patient not taking: Reported on 01/07/2022)     ondansetron (ZOFRAN) 4 MG tablet Take 1 tablet (4 mg total) by mouth every 8 (eight) hours as needed for nausea or vomiting. (Patient not taking: Reported on 12/22/2021) 10 tablet 0   [place] CBD couple time a day (Patient not taking: Reported on 01/07/2022)     oxyCODONE-acetaminophen (PERCOCET) 5-325 MG tablet Take 1 tablet by mouth every 6 (six) hours as needed for severe pain. (Patient not taking: Reported on 12/22/2021) 8 tablet 0   No current facility-administered medications on file prior to visit.    Allergies:   Allergies  Allergen Reactions   Motrin [Ibuprofen] Hives and Other (See Comments)    Pt says her feel "tingly headed" and causes hives   Other Nausea And Vomiting    Opioid pain meds    Aleve [Naproxen] Rash   Latex Rash      OBJECTIVE:  Physical Exam  Vitals:   01/07/22 1507  BP: 113/74  Pulse: 89  Weight: 98 lb 6 oz (44.6 kg)  Height: '5\' 1"'$  (1.549 m)    Body mass index is 18.59 kg/m. No results  found.   General: Frail very pleasant elderly Caucasian female, strong smoke odor, seated, in no evident distress Head: head normocephalic and atraumatic.   Neck: supple with no carotid or supraclavicular bruits Cardiovascular: regular rate and rhythm, no murmurs Musculoskeletal: no deformity Skin:  no rash/petichiae Vascular:  Normal pulses all extremities   Neurologic Exam Mental Status: Awake and fully alert. mild dysarthria although poor denture. Oriented to place and time. Recent and remote memory intact. Attention span, concentration and fund of knowledge appropriate. Mood and affect appropriate.  Cranial Nerves: Pupils equal, briskly reactive to light. Extraocular movements full without nystagmus. Visual fields full to confrontation. Hearing intact. Facial sensation intact. Face, tongue, palate moves normally and symmetrically.  Motor: Normal strength, bulk and tone right upper and lower extremity LUE: 4+/5 proximal, 3+/4 hand grip, decreased hand ROM  LLE: 5/5 Sensory.: intact to touch , pinprick , position and vibratory sensation.  Coordination: Rapid alternating movements normal on right side. Finger-to-nose and heel-to-shin performed accurately right side and noted Left sided dysmetria  Gait and Station: Arises from chair without difficulty. Stance is normal. Gait demonstrates decreased step height and stride length LLE with mild unsteadiness. No AD used. Tandem walk and heel toe not attempted.  Reflexes: 3+ LUE otherwise 1+. Toes downgoing.         ASSESSMENT: Shanterica Biehler is a 71 y.o. year old female chronic progressive left arm weakness since 2019 with MR brain 07/2021 showing chronic R frontal lobe stroke. Further stroke work up showed b/l ICA 80 to 99% stenosis s/p R TCAR 10/2021 and R VA occlusion.  Vascular risk factors include HTN, HLD, tobacco use, and carotid stenosis.      PLAN:  Right frontal lobe stroke:  Likely etiology of left sided weakness with  spasticity.  Plan on pursuing PT/OT after completion of carotid procedure in August.  She is aware to call office if interested in pursuing or advise she can further discuss with PCP.  Continue aspirin 81 mg daily  and atorvastatin 80 mg daily for secondary  stroke prevention  Stroke labs: A1c 5.6 (08/2021), LDL 75 (10/2021) Discussed secondary stroke prevention measures and importance of close PCP follow up for aggressive stroke risk factor management including BP goal<130/90, and HLD with LDL goal<70 I have gone over the pathophysiology of stroke, warning signs and symptoms, risk factors and their management in some detail with instructions to go to the closest emergency room for symptoms of concern.  B/l carotid stenosis: s/p R TCAR 11/21/2021 by Dr. Trula Slade. Plans for left sided intervention in August  Tobacco use: discussed importance of complete tobacco cessation as continued use greatly increases risk of additional strokes and further stenosis or restenosis.  She plans on further discussing use of patches with PCP next week    Doing well from stroke standpoint without further recommendations and risk factors are managed by PCP. She may follow up PRN, as usual for our patients who are strictly being followed for stroke. If any new neurological issues should arise, request PCP place referral for evaluation by one of our neurologists. Thank you.     CC:  PCP: Emelia Loron, NP    I spent 31 minutes of face-to-face and non-face-to-face time with patient and friend.  This included previsit chart review, lab review, study review, electronic health record documentation, patient and friend education and discussion regarding history of prior stroke with residual deficits, secondary stroke prevention measures and aggressive stroke risk factor management, carotid stenosis and interventions and answered all other questions to patient and friend satisfaction   Frann Rider, AGNP-BC  [facility] [address] [place], [place] [address]  Phone [phone] Fax [phone] Note: This document was prepared with digital dictation and possible smart phrase technology. Any transcriptional errors that result from this process are unintentional.

## 2022-01-08 ENCOUNTER — Ambulatory Visit: Payer: Medicare Other | Admitting: Adult Health

## 2022-01-14 ENCOUNTER — Other Ambulatory Visit: Payer: Self-pay

## 2022-01-14 DIAGNOSIS — I6523 Occlusion and stenosis of bilateral carotid arteries: Secondary | ICD-10-CM

## 2022-01-15 ENCOUNTER — Telehealth: Payer: Self-pay

## 2022-01-15 NOTE — Telephone Encounter (Signed)
Pt's friend called stating that the pt was unable to get the blood work done that was requested post-op and didn't understand why the pt was being stuck vs using her port, which was ordered by Dr Trula Slade.  Reviewed pt's chart, returned call clarification, two identifiers used. Per chart, pt was to f/u with PCP in one week's time after d/c and get labs to check kidney function. Pt was unable to get them until yesterday. Pt was stuck multiple times unsuccessfully. Instructed pt to call PCP to place order for the port to be used for labs and facilitate the appt to get those done, if they still wanted them done 2 months after the surgery. Since she is having pre-op blood work done in August, she should tell her PCP and maybe they would want them done at the same time. Pt to call PCP to discuss. Confirmed understanding.

## 2022-01-21 ENCOUNTER — Other Ambulatory Visit: Payer: Self-pay

## 2022-01-21 ENCOUNTER — Inpatient Hospital Stay: Payer: Medicare Other | Attending: Hematology and Oncology | Admitting: Hematology and Oncology

## 2022-01-21 ENCOUNTER — Encounter: Payer: Self-pay | Admitting: Hematology and Oncology

## 2022-01-21 ENCOUNTER — Inpatient Hospital Stay: Payer: Medicare Other

## 2022-01-21 VITALS — BP 159/82 | HR 81 | Temp 97.5°F | Resp 18 | Ht 61.0 in | Wt 97.8 lb

## 2022-01-21 DIAGNOSIS — F17210 Nicotine dependence, cigarettes, uncomplicated: Secondary | ICD-10-CM | POA: Insufficient documentation

## 2022-01-21 DIAGNOSIS — Z9011 Acquired absence of right breast and nipple: Secondary | ICD-10-CM | POA: Insufficient documentation

## 2022-01-21 DIAGNOSIS — C50111 Malignant neoplasm of central portion of right female breast: Secondary | ICD-10-CM | POA: Diagnosis present

## 2022-01-21 DIAGNOSIS — Z170 Estrogen receptor positive status [ER+]: Secondary | ICD-10-CM | POA: Diagnosis not present

## 2022-01-21 DIAGNOSIS — Z79899 Other long term (current) drug therapy: Secondary | ICD-10-CM | POA: Insufficient documentation

## 2022-01-21 NOTE — Assessment & Plan Note (Signed)
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  This was diagnosed back in October 2015.    She underwent neoadjuvant chemotherapy with dose dense Adriamycin and Cytoxan followed by weekly Taxol, completed 9 weeks of Taxol and discontinued because of neuropathy.   She underwent right modified radical mastectomy in April 2016 which revealed 2.3 cm residual tumor with 3 out of 11 positive lymph nodes.  Final pathologic staging pT2 pN1 M0, stage IIb.  She received adjuvant postmastectomy radiation which completed in July 2016.  She was prescribed adjuvant Arimidex in June 2016.    She reported some left upper extremity weakness back in June 2021 and had an MRI brain and MRI cervical spine which showed no definitive evidence of metastatic disease according to the report.  She was recommended to have follow-up MRIs but she could not do these, she had to move out of [place] because of lack of place to live.   She had a repeat MRI brain and cervical spine recently which did not show any evidence of distant metastatic disease.  I have encouraged her to continue anastrozole at this time. Since she had an interrupted time when she did not take anastrozole for almost a year, I think she will benefit from at least 7 years of antiestrogen therapy.  We will plan to continue it until at least May 2024 and reevaluate.  She is here for follow-up with no new complaints except for some carotid artery stenosis issues and need for some procedures which are upcoming. She is still behind on her mammogram schedule.  I have once again encouraged her to get this scheduled ASAP.  She also needs bone density scan. On physical examination, no concerns  for recurrence of malignancy today.  Port flush arranged today.  She will return to clinic in 6 months or sooner as needed. Phone number to call and schedule for mammogram has been provided once again to the patient.  Bone density has been ordered

## 2022-01-21 NOTE — Progress Notes (Signed)
[facility] NOTE  Patient Care Team: Emelia Loron, NP as PCP - General (Nurse Practitioner) Iran Planas, MD as Consulting Physician (Orthopedic Surgery)  CHIEF COMPLAINTS/PURPOSE OF CONSULTATION:  Newly diagnosed breast cancer  HISTORY OF PRESENTING ILLNESS:  Wanda Harrington 71 y.o. 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, estrogen receptor positive ([place])  10/08/2014 Surgery   T2 N1 M0 right breast IDC, stage IIb ER diagnosis, grade 2, ER 96%, PR 10%, KI of 20% negative for HER2 by IHC and FISH diagnosed in April 05, 2014 treated with neoadjuvant chemotherapy with dose dense Adriamycin and Cytoxan followed by weekly Taxol 05/03/2014 to August 30, 2014, received 9 weeks of Taxol because of neuropathy, status post right modified radical mastectomy on October 08, 2014 which revealed a 2.3 cm residual tumor with 3 out of 11 positive lymph nodes, adjuvant postmastectomy radiation completed on December 28, 2014 and adjuvant Arimidex prescribed on December 27, 2014.   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  Patient denies any noncompliance to anastrozole.  Since her last visit, she had some vascular intervention for her carotid artery stenosis and is anticipating more procedures.  She is still  unable to schedule her mammogram.  She denies any new breast masses.  No changes in her breathing or new bone pains.  No change in bowel or urinary habits.  Rest of the pertinent review of systems reviewed and negative  MEDICAL HISTORY:  Past Medical History:  Diagnosis Date   Anxiety    BP (high blood pressure)    Cancer (HCC)    Right breast, pt had R breast mastectomy including lymph node removal   Depression    Hepatitis    Hep C, pt states she has not had treatment   High cholesterol    Neuropathy    Stroke ([place]) 2021    SURGICAL HISTORY: Past Surgical History:  Procedure Laterality Date   breast cancer Right 2016   EXTRACORPOREAL SHOCK WAVE LITHOTRIPSY Right 11/21/2021   Procedure: INTRAVASCULAR SHOCK WAVE LITHOTRIPSY RIGHT CAROTID ARTERY;  Surgeon: Serafina Mitchell, MD;  Location: [facility] OR;  Service: Vascular;  Laterality: Right;   MASTECTOMY Right 2016   including lymph node removal   PORTACATH PLACEMENT  2016   TRANSCAROTID ARTERY REVASCULARIZATION  Right 11/21/2021   Procedure: Right Transcarotid Artery Revascularization;  Surgeon: Serafina Mitchell, MD;  Location: [facility] OR;  Service: Vascular;  Laterality: Right;   ULTRASOUND GUIDANCE FOR VASCULAR ACCESS Left 11/21/2021   Procedure: ULTRASOUND GUIDANCE FOR VASCULAR ACCESS;  Surgeon: Serafina Mitchell, MD;  Location: [facility] OR;  Service: Vascular;  Laterality: Left;    SOCIAL HISTORY: Social History   Socioeconomic History   Marital status: Widowed    Spouse name: Not on file   Number of children: 0   Years of education: Not  on file   Highest education level: Not on file  Occupational History   Not on file  Tobacco Use   Smoking status: Every Day    Packs/day: 1.00    Types: Cigarettes   Smokeless tobacco: Never  Vaping Use   Vaping Use: Never used  Substance and Sexual Activity   Alcohol use: Yes    Comment: pt may have one drink once a month   Drug use: Yes    Types: Marijuana    Comment: every day   Sexual  activity: Not on file  Other Topics Concern   Not on file  Social History Narrative   Not on file   Social Determinants of Health   Financial Resource Strain: Not on file  Food Insecurity: Not on file  Transportation Needs: Not on file  Physical Activity: Not on file  Stress: Not on file  Social Connections: Not on file  Intimate Partner Violence: Not on file    FAMILY HISTORY: Family History  Adopted: Yes  Family history unknown: Yes    ALLERGIES:  is allergic to motrin [ibuprofen], other, aleve [naproxen], and latex.  MEDICATIONS:  Current Outpatient Medications  Medication Sig Dispense Refill   ALPRAZolam (XANAX) 0.25 MG tablet Take 0.25 mg by mouth 2 (two) times daily as needed for anxiety.     aluminum hydroxide-magnesium carbonate (GAVISCON) 95-358 MG/15ML SUSP Take 15 mLs by mouth as needed.     anastrozole (ARIMIDEX) 1 MG tablet Take 1 tablet (1 mg total) by mouth daily. 30 tablet 3   aspirin EC 81 MG tablet Take 81 mg by mouth daily. Swallow whole.     atorvastatin (LIPITOR) 80 MG tablet Take 1 tablet (80 mg total) by mouth daily. 90 tablet 3   cholecalciferol (VITAMIN D3) 25 MCG (1000 UNIT) tablet Take 1,000 Units by mouth daily. (Patient not taking: Reported on 01/07/2022)     clopidogrel (PLAVIX) 75 MG tablet Take 1 tablet (75 mg total) by mouth daily. 30 tablet 6   ezetimibe (ZETIA) 10 MG tablet Take 1 tablet (10 mg total) by mouth daily. 90 tablet 4   fluticasone (FLONASE) 50 MCG/ACT nasal spray Place 1 spray into both nostrils 2 (two) times daily as needed for allergies.     loratadine (CLARITIN) 10 MG tablet Take 10 mg by mouth daily as needed for allergies.     ondansetron (ZOFRAN) 4 MG tablet Take 1 tablet (4 mg total) by mouth every 8 (eight) hours as needed for nausea or vomiting. (Patient not taking: Reported on 12/22/2021) 10 tablet 0   [place] CBD couple time a day (Patient not taking: Reported on 01/07/2022)      oxyCODONE-acetaminophen (PERCOCET) 5-325 MG tablet Take 1 tablet by mouth every 6 (six) hours as needed for severe pain. (Patient not taking: Reported on 12/22/2021) 8 tablet 0   polyethylene glycol (MIRALAX / GLYCOLAX) 17 g packet Take 17 g by mouth daily as needed for moderate constipation.     pregabalin (LYRICA) 50 MG capsule Take 50 mg by mouth 2 (two) times daily.     No current facility-administered medications for this visit.    REVIEW OF SYSTEMS:   Constitutional: Denies fevers, chills or abnormal night sweats Eyes: Denies blurriness of vision, double vision or watery eyes Ears, nose, mouth, throat, and face: Denies mucositis or sore throat Respiratory: Denies cough, dyspnea or wheezes Cardiovascular: Denies palpitation, chest discomfort or lower extremity swelling Gastrointestinal:  Denies nausea, heartburn or  change in bowel habits Skin: Denies abnormal skin rashes Lymphatics: Denies new lymphadenopathy or easy bruising Neurological:Denies numbness, tingling or new weaknesses Behavioral/Psych: Mood is stable, no new changes  Breast: Denies any palpable lumps or discharge All other systems were reviewed with the patient and are negative.  PHYSICAL EXAMINATION: ECOG PERFORMANCE STATUS: 0 - Asymptomatic  Vitals:   01/21/22 1541  BP: (!) 159/82  Pulse: 81  Resp: 18  Temp: (!) 97.5 F (36.4 C)  SpO2: 95%    Filed Weights   01/21/22 1541  Weight: 97 lb 12.8 oz (44.4 kg)   Physical Exam Constitutional:      Appearance: Normal appearance.     Comments: Appears older than stated age.   Cardiovascular:     Rate and Rhythm: Normal rate and regular rhythm.  Pulmonary:     Effort: Pulmonary effort is normal.     Breath sounds: Normal breath sounds.  Chest:     Comments: Status post right mastectomy.  Left breast normal to inspection and palpation.  No palpable regional adenopathy Musculoskeletal:        General: Swelling (Bilateral chronic venous stasis changes)  present. Normal range of motion.     Cervical back: Normal range of motion and neck supple. No rigidity.  Lymphadenopathy:     Cervical: No cervical adenopathy.  Neurological:     Mental Status: She is alert.     LABORATORY DATA:  I have reviewed the data as listed Lab Results  Component Value Date   WBC 9.1 11/22/2021   HGB 11.3 (L) 11/22/2021   HCT 33.9 (L) 11/22/2021   MCV 89.7 11/22/2021   PLT 182 11/22/2021   Lab Results  Component Value Date   NA 133 (L) 11/23/2021   K 5.0 11/23/2021   CL 104 11/23/2021   CO2 24 11/23/2021    RADIOGRAPHIC STUDIES: I have personally reviewed the radiological reports and agreed with the findings in the report.  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  This was diagnosed back in October 2015.    She underwent neoadjuvant chemotherapy with dose dense Adriamycin and Cytoxan followed by weekly Taxol, completed 9 weeks of Taxol and discontinued because of neuropathy.   She underwent right modified radical mastectomy in April 2016 which revealed 2.3 cm residual tumor with 3 out of 11 positive lymph nodes.  Final pathologic staging pT2 pN1 M0, stage IIb.  She received adjuvant postmastectomy radiation which completed in July 2016.  She was prescribed adjuvant Arimidex in June 2016.    She reported some left upper extremity weakness back in June 2021 and had an MRI brain and MRI cervical spine which showed no definitive evidence of metastatic disease according to the report.  She was recommended to have follow-up MRIs but she could not do these, she had to  move out of [place] because of lack of place to live.   She had a repeat MRI brain and cervical spine recently which did not show any evidence of distant metastatic disease.  I have encouraged her to continue anastrozole at this time. Since she had an interrupted time when she  did not take anastrozole for almost a year, I think she will benefit from at least 7 years of antiestrogen therapy.  We will plan to continue it until at least May 2024 and reevaluate.  She is here for follow-up with no new complaints except for some carotid artery stenosis issues and need for some procedures which are upcoming. She is still behind on her mammogram schedule.  I have once again encouraged her to get this scheduled ASAP.  She also needs bone density scan. On physical examination, no concerns for recurrence of malignancy today.  Port flush arranged today.  She will return to clinic in 6 months or sooner as needed. Phone number to call and schedule for mammogram has been provided once again to the patient.  Bone density has been ordered    All questions were answered. The patient knows to call the clinic with any problems, questions or concerns. I spent 60 minutes in the care of this patient reviewing her history, previous medical records, counseling, coordination of care.    Benay Pike, MD 01/21/22

## 2022-02-11 NOTE — Pre-Procedure Instructions (Signed)
Surgical Instructions    Your procedure is scheduled on Wednesday, August 23rd.  Report to [facility] Main Entrance "A" at 05:30 A.M., then check in with the Admitting office.  Call this number if you have problems the morning of surgery:  [phone]   If you have any questions prior to your surgery date call [phone]: Open Monday-Friday 8am-4pm    Remember:  Do not eat or drink after midnight the night before your surgery     Take these medicines the morning of surgery with A SIP OF WATER  anastrozole (ARIMIDEX)  atorvastatin (LIPITOR)  ezetimibe (ZETIA)  fluticasone (FLONASE)  pregabalin (LYRICA)    If needed: acetaminophen (TYLENOL) ALPRAZolam (XANAX) cetirizine (ZYRTEC)  loratadine (CLARITIN)   Follow your surgeon's instructions on when/if to stop Aspirin and Plavix.  If no instructions were given by your surgeon then you will need to call the office to get those instructions.     As of today, STOP taking any Aspirin (unless otherwise instructed by your surgeon) Aleve, Naproxen, Ibuprofen, Motrin, Advil, Goody's, BC's, all herbal medications, fish oil, and all vitamins.                     Do NOT Smoke (Tobacco/Vaping) for 24 hours prior to your procedure.  If you use a CPAP at night, you may bring your mask/headgear for your overnight stay.   Contacts, glasses, piercing's, hearing aid's, dentures or partials may not be worn into surgery, please bring cases for these belongings.    For patients admitted to the hospital, discharge time will be determined by your treatment team.   Patients discharged the day of surgery will not be allowed to drive home, and someone needs to stay with them for 24 hours.  SURGICAL WAITING ROOM VISITATION Patients having surgery or a procedure may have no more than 2 support people in the waiting area - these visitors may rotate.   Children under the age of 16 must have an adult with them who is not the patient. If the patient  needs to stay at the hospital during part of their recovery, the visitor guidelines for inpatient rooms apply. Pre-op nurse will coordinate an appropriate time for 1 support person to accompany patient in pre-op.  This support person may not rotate.   Please refer to the [facility] website for the visitor guidelines for Inpatients (after your surgery is over and you are in a regular room).    Special instructions:   [facility]- Preparing For Surgery  Before surgery, you can play an important role. Because skin is not sterile, your skin needs to be as free of germs as possible. You can reduce the number of germs on your skin by washing with CHG (chlorahexidine gluconate) Soap before surgery.  CHG is an antiseptic cleaner which kills germs and bonds with the skin to continue killing germs even after washing.    Oral Hygiene is also important to reduce your risk of infection.  Remember - BRUSH YOUR TEETH THE MORNING OF SURGERY WITH YOUR REGULAR TOOTHPASTE  Please do not use if you have an allergy to CHG or antibacterial soaps. If your skin becomes reddened/irritated stop using the CHG.  Do not shave (including legs and underarms) for at least 48 hours prior to first CHG shower. It is OK to shave your face.  Please follow these instructions carefully.   Shower the NIGHT BEFORE SURGERY and the MORNING OF SURGERY  If you chose to wash your  hair, wash your hair first as usual with your normal shampoo.  After you shampoo, rinse your hair and body thoroughly to remove the shampoo.  Use CHG Soap as you would any other liquid soap. You can apply CHG directly to the skin and wash gently with a scrungie or a clean washcloth.   Apply the CHG Soap to your body ONLY FROM THE NECK DOWN.  Do not use on open wounds or open sores. Avoid contact with your eyes, ears, mouth and genitals (private parts). Wash Face and genitals (private parts)  with your normal soap.   Wash thoroughly, paying special attention  to the area where your surgery will be performed.  Thoroughly rinse your body with warm water from the neck down.  DO NOT shower/wash with your normal soap after using and rinsing off the CHG Soap.  Pat yourself dry with a CLEAN TOWEL.  Wear CLEAN PAJAMAS to bed the night before surgery  Place CLEAN SHEETS on your bed the night before your surgery  DO NOT SLEEP WITH PETS.   Day of Surgery: Take a shower with CHG soap. Do not wear jewelry or makeup Do not wear lotions, powders, perfumes, or deodorant. Do not shave 48 hours prior to surgery.   Do not bring valuables to the hospital. [facility] is not responsible for any belongings or valuables. Do not wear nail polish, gel polish, artificial nails, or any other type of covering on natural nails (fingers and toes) If you have artificial nails or gel coating that need to be removed by a nail salon, please have this removed prior to surgery. Artificial nails or gel coating may interfere with anesthesia's ability to adequately monitor your vital signs. Wear Clean/Comfortable clothing the morning of surgery Remember to brush your teeth WITH YOUR REGULAR TOOTHPASTE.   Please read over the following fact sheets that you were given.    If you received a COVID test during your pre-op visit  it is requested that you wear a mask when out in public, stay away from anyone that may not be feeling well and notify your surgeon if you develop symptoms. If you have been in contact with anyone that has tested positive in the last 10 days please notify you surgeon.

## 2022-02-12 ENCOUNTER — Encounter (HOSPITAL_COMMUNITY): Payer: Self-pay

## 2022-02-12 ENCOUNTER — Encounter (HOSPITAL_COMMUNITY)
Admission: RE | Admit: 2022-02-12 | Discharge: 2022-02-12 | Disposition: A | Discharge status: Home / Self Care | Payer: Medicare Other | Source: Ambulatory Visit | Attending: Surgery | Admitting: Surgery

## 2022-02-12 ENCOUNTER — Other Ambulatory Visit: Payer: Self-pay

## 2022-02-12 VITALS — BP 126/79 | HR 87 | Temp 97.8°F | Resp 17 | Ht 61.0 in | Wt 99.5 lb

## 2022-02-12 DIAGNOSIS — Z01812 Encounter for preprocedural laboratory examination: Secondary | ICD-10-CM | POA: Insufficient documentation

## 2022-02-12 DIAGNOSIS — Z01818 Encounter for other preprocedural examination: Secondary | ICD-10-CM

## 2022-02-12 DIAGNOSIS — I6523 Occlusion and stenosis of bilateral carotid arteries: Secondary | ICD-10-CM | POA: Diagnosis not present

## 2022-02-12 LAB — CBC
HCT: 48.6 % — ABNORMAL HIGH (ref 36.0–46.0)
Hemoglobin: 15.6 g/dL — ABNORMAL HIGH (ref 12.0–15.0)
MCH: 31 pg (ref 26.0–34.0)
MCHC: 32.1 g/dL (ref 30.0–36.0)
MCV: 96.6 fL (ref 80.0–100.0)
Platelets: 192 10*3/uL (ref 150–400)
RBC: 5.03 MIL/uL (ref 3.87–5.11)
RDW: 12.7 % (ref 11.5–15.5)
WBC: 5.2 10*3/uL (ref 4.0–10.5)
nRBC: 0 % (ref 0.0–0.2)

## 2022-02-12 LAB — URINALYSIS, ROUTINE W REFLEX MICROSCOPIC
Bilirubin Urine: NEGATIVE
Glucose, UA: NEGATIVE mg/dL
Hgb urine dipstick: NEGATIVE
Ketones, ur: NEGATIVE mg/dL
Nitrite: NEGATIVE
Protein, ur: NEGATIVE mg/dL
Specific Gravity, Urine: 1.016 (ref 1.005–1.030)
WBC, UA: >50 WBC/hpf — ABNORMAL HIGH (ref 0–5)
pH: 5 (ref 5.0–8.0)

## 2022-02-12 LAB — TYPE AND SCREEN
ABO/RH(D): O POS
Antibody Screen: NEGATIVE

## 2022-02-12 LAB — PROTIME-INR
INR: 1.1 (ref 0.8–1.2)
Prothrombin Time: 13.6 seconds (ref 11.4–15.2)

## 2022-02-12 LAB — COMPREHENSIVE METABOLIC PANEL
ALT: 51 U/L — ABNORMAL HIGH (ref 0–44)
AST: 61 U/L — ABNORMAL HIGH (ref 15–41)
Albumin: 3.9 g/dL (ref 3.5–5.0)
Alkaline Phosphatase: 54 U/L (ref 38–126)
Anion gap: 8 (ref 5–15)
BUN: 18 mg/dL (ref 8–23)
CO2: 27 mmol/L (ref 22–32)
Calcium: 9.4 mg/dL (ref 8.9–10.3)
Chloride: 102 mmol/L (ref 98–111)
Creatinine, Ser: 1.04 mg/dL — ABNORMAL HIGH (ref 0.44–1.00)
GFR, Estimated: 58 mL/min — ABNORMAL LOW (ref >60)
Glucose, Bld: 89 mg/dL (ref 70–99)
Potassium: 4.8 mmol/L (ref 3.5–5.1)
Sodium: 137 mmol/L (ref 135–145)
Total Bilirubin: 0.3 mg/dL (ref 0.3–1.2)
Total Protein: 7.4 g/dL (ref 6.5–8.1)

## 2022-02-12 LAB — SURGICAL PCR SCREEN
MRSA, PCR: NEGATIVE
Staphylococcus aureus: NEGATIVE

## 2022-02-12 LAB — APTT: aPTT: 32 seconds (ref 24–36)

## 2022-02-12 NOTE — Progress Notes (Signed)
Staff message sent to Dr. Trula Slade to let him know of the abnormal UA result.

## 2022-02-12 NOTE — Progress Notes (Addendum)
PCP - Emelia Loron, NP with [facility] Cardiologist - Denies  PPM/ICD - Denies  Chest x-ray - NI EKG - 11/17/21 Stress Test - Denies ECHO - 08/20/21 Cardiac Cath - Denies  Sleep Study - Denies  DM - Denies  Blood Thinner Instructions: Per surgeon's offfice will continue Plavix through DOS Aspirin Instructions: Per surgeon's offfice will continue Plavix through DOS  Anesthesia review: Yes recent Transcarotid revascularization  Patient denies shortness of breath, fever, cough and chest pain at PAT appointment   All instructions explained to the patient, with a verbal understanding of the material. Patient agrees to go over the instructions while at home for a better understanding. The opportunity to ask questions was provided.

## 2022-02-16 ENCOUNTER — Telehealth: Payer: Self-pay

## 2022-02-16 ENCOUNTER — Other Ambulatory Visit: Payer: Self-pay

## 2022-02-16 MED ORDER — SULFAMETHOXAZOLE-TRIMETHOPRIM 800-160 MG PO TABS: 1.0000 | ORAL_TABLET | Freq: Two times a day (BID) | ORAL | 0 refills | Status: AC

## 2022-02-16 NOTE — Progress Notes (Signed)
Spoke to [employer] at Dr. Stephens Shire office regarding the patient's abnormal urine specimen and Dr. Trula Slade wanting to start an antibiotic.

## 2022-02-16 NOTE — Telephone Encounter (Signed)
Pt was called to let her know she has a rx called in for Bactrim DS due to the results of her U/A, per MD. Pt does not have transportation until this evening to get to pharmacy. I have advised her to start them as soon as she can and continue BID x 3 days. Pt verbalized understanding.

## 2022-02-18 ENCOUNTER — Inpatient Hospital Stay (HOSPITAL_COMMUNITY): Payer: Medicare Other

## 2022-02-18 ENCOUNTER — Encounter (HOSPITAL_COMMUNITY): Payer: Self-pay | Admitting: Surgery

## 2022-02-18 ENCOUNTER — Other Ambulatory Visit: Payer: Self-pay

## 2022-02-18 ENCOUNTER — Encounter (HOSPITAL_COMMUNITY)
Admission: RE | Disposition: A | Discharge status: Home / Self Care | Payer: Self-pay | Source: Home / Self Care | Attending: Surgery

## 2022-02-18 ENCOUNTER — Inpatient Hospital Stay (HOSPITAL_COMMUNITY)
Admission: RE | Admit: 2022-02-18 | Discharge: 2022-02-19 | DRG: 036 | Disposition: A | Discharge status: Home / Self Care | Payer: Medicare Other | Attending: Surgery | Admitting: Surgery

## 2022-02-18 ENCOUNTER — Inpatient Hospital Stay (HOSPITAL_COMMUNITY): Payer: Medicare Other | Admitting: Physician Assistant

## 2022-02-18 DIAGNOSIS — I10 Essential (primary) hypertension: Secondary | ICD-10-CM

## 2022-02-18 DIAGNOSIS — Z9011 Acquired absence of right breast and nipple: Secondary | ICD-10-CM | POA: Diagnosis not present

## 2022-02-18 DIAGNOSIS — I6529 Occlusion and stenosis of unspecified carotid artery: Secondary | ICD-10-CM | POA: Diagnosis present

## 2022-02-18 DIAGNOSIS — F419 Anxiety disorder, unspecified: Secondary | ICD-10-CM | POA: Diagnosis present

## 2022-02-18 DIAGNOSIS — I6522 Occlusion and stenosis of left carotid artery: Secondary | ICD-10-CM

## 2022-02-18 DIAGNOSIS — F32A Depression, unspecified: Secondary | ICD-10-CM | POA: Diagnosis present

## 2022-02-18 DIAGNOSIS — B1920 Unspecified viral hepatitis C without hepatic coma: Secondary | ICD-10-CM | POA: Diagnosis present

## 2022-02-18 DIAGNOSIS — Z006 Encounter for examination for normal comparison and control in clinical research program: Secondary | ICD-10-CM | POA: Diagnosis not present

## 2022-02-18 DIAGNOSIS — E7800 Pure hypercholesterolemia, unspecified: Secondary | ICD-10-CM | POA: Diagnosis present

## 2022-02-18 DIAGNOSIS — I69364 Other paralytic syndrome following cerebral infarction affecting left non-dominant side: Secondary | ICD-10-CM

## 2022-02-18 DIAGNOSIS — F418 Other specified anxiety disorders: Secondary | ICD-10-CM

## 2022-02-18 DIAGNOSIS — Z7902 Long term (current) use of antithrombotics/antiplatelets: Secondary | ICD-10-CM | POA: Diagnosis not present

## 2022-02-18 DIAGNOSIS — R7989 Other specified abnormal findings of blood chemistry: Secondary | ICD-10-CM | POA: Diagnosis present

## 2022-02-18 DIAGNOSIS — Z7982 Long term (current) use of aspirin: Secondary | ICD-10-CM | POA: Diagnosis not present

## 2022-02-18 DIAGNOSIS — F17200 Nicotine dependence, unspecified, uncomplicated: Secondary | ICD-10-CM

## 2022-02-18 DIAGNOSIS — Z79899 Other long term (current) drug therapy: Secondary | ICD-10-CM

## 2022-02-18 DIAGNOSIS — Z95828 Presence of other vascular implants and grafts: Secondary | ICD-10-CM

## 2022-02-18 HISTORY — PX: TRANSCAROTID ARTERY REVASCULARIZATIONÂ: SHX6778

## 2022-02-18 LAB — CBC
HCT: 33.7 % — ABNORMAL LOW (ref 36.0–46.0)
Hemoglobin: 11.2 g/dL — ABNORMAL LOW (ref 12.0–15.0)
MCH: 31.2 pg (ref 26.0–34.0)
MCHC: 33.2 g/dL (ref 30.0–36.0)
MCV: 93.9 fL (ref 80.0–100.0)
Platelets: 142 10*3/uL — ABNORMAL LOW (ref 150–400)
RBC: 3.59 MIL/uL — ABNORMAL LOW (ref 3.87–5.11)
RDW: 12.6 % (ref 11.5–15.5)
WBC: 6.2 10*3/uL (ref 4.0–10.5)
nRBC: 0 % (ref 0.0–0.2)

## 2022-02-18 LAB — CREATININE, SERUM
Creatinine, Ser: 1.1 mg/dL — ABNORMAL HIGH (ref 0.44–1.00)
GFR, Estimated: 54 mL/min — ABNORMAL LOW (ref >60)

## 2022-02-18 LAB — POCT ACTIVATED CLOTTING TIME: Activated Clotting Time: 281 seconds

## 2022-02-18 SURGERY — TRANSCAROTID ARTERY REVASCULARIZATION (TCAR)
Anesthesia: General | Site: Neck | Laterality: Left

## 2022-02-18 MED ORDER — MAGNESIUM SULFATE 2 GM/50ML IV SOLN: 2.0000 g | Freq: Every day | INTRAVENOUS | Status: DC | PRN

## 2022-02-18 MED ORDER — ATORVASTATIN CALCIUM 80 MG PO TABS
80.0000 mg | ORAL_TABLET | Freq: Every day | ORAL | Status: DC
  Administered 2022-02-19: 80 mg via ORAL
  Filled 2022-02-18: qty 1

## 2022-02-18 MED ORDER — DEXAMETHASONE SODIUM PHOSPHATE 10 MG/ML IJ SOLN
INTRAMUSCULAR | Status: AC
Start: 2022-02-18 — End: ?
  Filled 2022-02-18: qty 1

## 2022-02-18 MED ORDER — BISACODYL 10 MG RE SUPP: 10.0000 mg | Freq: Every day | RECTAL | Status: DC | PRN

## 2022-02-18 MED ORDER — LACTATED RINGERS IV SOLN: INTRAVENOUS | Status: DC | PRN

## 2022-02-18 MED ORDER — LIDOCAINE 2% (20 MG/ML) 5 ML SYRINGE
INTRAMUSCULAR | Status: AC
  Filled 2022-02-18: qty 5

## 2022-02-18 MED ORDER — ATROPINE SULFATE 0.4 MG/ML IV SOLN
INTRAVENOUS | Status: AC
  Filled 2022-02-18: qty 1

## 2022-02-18 MED ORDER — DOCUSATE SODIUM 100 MG PO CAPS
100.0000 mg | ORAL_CAPSULE | Freq: Every day | ORAL | Status: DC
  Administered 2022-02-19: 100 mg via ORAL
  Filled 2022-02-18: qty 1

## 2022-02-18 MED ORDER — ATROPINE SULFATE 0.4 MG/ML IV SOLN
INTRAVENOUS | Status: DC | PRN
  Administered 2022-02-18: .2 mg via INTRAVENOUS

## 2022-02-18 MED ORDER — GUAIFENESIN-DM 100-10 MG/5ML PO SYRP: 15.0000 mL | ORAL_SOLUTION | ORAL | Status: DC | PRN

## 2022-02-18 MED ORDER — ROCURONIUM BROMIDE 10 MG/ML (PF) SYRINGE
PREFILLED_SYRINGE | INTRAVENOUS | Status: AC
  Filled 2022-02-18: qty 10

## 2022-02-18 MED ORDER — EPHEDRINE 5 MG/ML INJ
INTRAVENOUS | Status: AC
  Filled 2022-02-18: qty 5

## 2022-02-18 MED ORDER — ONDANSETRON HCL 4 MG/2ML IJ SOLN
INTRAMUSCULAR | Status: AC
  Filled 2022-02-18: qty 2

## 2022-02-18 MED ORDER — PHENYLEPHRINE HCL-NACL 20-0.9 MG/250ML-% IV SOLN
INTRAVENOUS | Status: DC | PRN
  Administered 2022-02-18: 20 ug/min via INTRAVENOUS

## 2022-02-18 MED ORDER — SULFAMETHOXAZOLE-TRIMETHOPRIM 800-160 MG PO TABS
1.0000 | ORAL_TABLET | Freq: Two times a day (BID) | ORAL | Status: DC
  Administered 2022-02-18 – 2022-02-19 (×2): 1 via ORAL
  Filled 2022-02-18 (×2): qty 1

## 2022-02-18 MED ORDER — ACETAMINOPHEN 160 MG/5ML PO SOLN: 325.0000 mg | ORAL | Status: DC | PRN

## 2022-02-18 MED ORDER — FENTANYL CITRATE (PF) 250 MCG/5ML IJ SOLN
INTRAMUSCULAR | Status: AC
  Filled 2022-02-18: qty 5

## 2022-02-18 MED ORDER — PHENOL 1.4 % MT LIQD
1.0000 | OROMUCOSAL | Status: DC | PRN
Start: 2022-02-18 — End: 2022-02-19

## 2022-02-18 MED ORDER — SODIUM CHLORIDE 0.9 % IV SOLN: INTRAVENOUS | Status: DC

## 2022-02-18 MED ORDER — HEPARIN 6000 UNIT IRRIGATION SOLUTION
Status: AC
  Filled 2022-02-18: qty 500

## 2022-02-18 MED ORDER — OXYCODONE HCL 5 MG PO TABS: 5.0000 mg | ORAL_TABLET | Freq: Once | ORAL | Status: DC | PRN

## 2022-02-18 MED ORDER — FENTANYL CITRATE (PF) 100 MCG/2ML IJ SOLN
25.0000 ug | INTRAMUSCULAR | Status: DC | PRN
  Administered 2022-02-18: 25 ug via INTRAVENOUS

## 2022-02-18 MED ORDER — AMISULPRIDE (ANTIEMETIC) 5 MG/2ML IV SOLN
10.0000 mg | Freq: Once | INTRAVENOUS | Status: AC | PRN
  Administered 2022-02-18: 10 mg via INTRAVENOUS

## 2022-02-18 MED ORDER — MORPHINE SULFATE (PF) 2 MG/ML IV SOLN: 2.0000 mg | INTRAVENOUS | Status: DC | PRN

## 2022-02-18 MED ORDER — ANASTROZOLE 1 MG PO TABS
1.0000 mg | ORAL_TABLET | Freq: Every day | ORAL | Status: DC
  Administered 2022-02-19: 1 mg via ORAL
  Filled 2022-02-18: qty 1

## 2022-02-18 MED ORDER — CHLORHEXIDINE GLUCONATE 0.12 % MT SOLN
15.0000 mL | Freq: Once | OROMUCOSAL | Status: AC
  Administered 2022-02-18: 15 mL via OROMUCOSAL
  Filled 2022-02-18: qty 15

## 2022-02-18 MED ORDER — CEFAZOLIN SODIUM-DEXTROSE 2-4 GM/100ML-% IV SOLN
2.0000 g | Freq: Three times a day (TID) | INTRAVENOUS | Status: AC
  Administered 2022-02-18 (×2): 2 g via INTRAVENOUS
  Filled 2022-02-18 (×2): qty 100

## 2022-02-18 MED ORDER — SUGAMMADEX SODIUM 200 MG/2ML IV SOLN
INTRAVENOUS | Status: DC | PRN
  Administered 2022-02-18: 200 mg via INTRAVENOUS

## 2022-02-18 MED ORDER — PROMETHAZINE HCL 25 MG/ML IJ SOLN: 6.2500 mg | INTRAMUSCULAR | Status: DC | PRN

## 2022-02-18 MED ORDER — EZETIMIBE 10 MG PO TABS
10.0000 mg | ORAL_TABLET | Freq: Every day | ORAL | Status: DC
  Administered 2022-02-19: 10 mg via ORAL
  Filled 2022-02-18: qty 1

## 2022-02-18 MED ORDER — LIDOCAINE 2% (20 MG/ML) 5 ML SYRINGE
INTRAMUSCULAR | Status: DC | PRN
  Administered 2022-02-18: 40 mg via INTRAVENOUS

## 2022-02-18 MED ORDER — CLOPIDOGREL BISULFATE 75 MG PO TABS
75.0000 mg | ORAL_TABLET | Freq: Every day | ORAL | Status: DC
  Administered 2022-02-19: 75 mg via ORAL
  Filled 2022-02-18: qty 1

## 2022-02-18 MED ORDER — CHLORHEXIDINE GLUCONATE CLOTH 2 % EX PADS: 6.0000 | MEDICATED_PAD | Freq: Once | CUTANEOUS | Status: DC

## 2022-02-18 MED ORDER — ACETAMINOPHEN 650 MG RE SUPP: 325.0000 mg | RECTAL | Status: DC | PRN

## 2022-02-18 MED ORDER — ORAL CARE MOUTH RINSE
15.0000 mL | Freq: Once | OROMUCOSAL | Status: AC
Start: 2022-02-18 — End: 2022-02-18

## 2022-02-18 MED ORDER — HEPARIN 6000 UNIT IRRIGATION SOLUTION
Status: DC | PRN
  Administered 2022-02-18: 1

## 2022-02-18 MED ORDER — GLYCOPYRROLATE PF 0.2 MG/ML IJ SOSY
PREFILLED_SYRINGE | INTRAMUSCULAR | Status: DC | PRN
  Administered 2022-02-18: .2 mg via INTRAVENOUS

## 2022-02-18 MED ORDER — ONDANSETRON HCL 4 MG/2ML IJ SOLN
INTRAMUSCULAR | Status: DC | PRN
  Administered 2022-02-18: 4 mg via INTRAVENOUS

## 2022-02-18 MED ORDER — PROPOFOL 10 MG/ML IV BOLUS
INTRAVENOUS | Status: DC | PRN
  Administered 2022-02-18: 70 mg via INTRAVENOUS

## 2022-02-18 MED ORDER — FENTANYL CITRATE (PF) 100 MCG/2ML IJ SOLN
INTRAMUSCULAR | Status: AC
  Filled 2022-02-18: qty 2

## 2022-02-18 MED ORDER — 0.9 % SODIUM CHLORIDE (POUR BTL) OPTIME
TOPICAL | Status: DC | PRN
  Administered 2022-02-18: 1000 mL

## 2022-02-18 MED ORDER — HEPARIN SODIUM (PORCINE) 1000 UNIT/ML IJ SOLN
INTRAMUSCULAR | Status: AC
  Filled 2022-02-18: qty 1

## 2022-02-18 MED ORDER — DEXAMETHASONE SODIUM PHOSPHATE 10 MG/ML IJ SOLN
INTRAMUSCULAR | Status: DC | PRN
  Administered 2022-02-18: 10 mg via INTRAVENOUS

## 2022-02-18 MED ORDER — POLYETHYLENE GLYCOL 3350 17 G PO PACK: 17.0000 g | PACK | Freq: Every day | ORAL | Status: DC | PRN

## 2022-02-18 MED ORDER — ALUM & MAG HYDROXIDE-SIMETH 200-200-20 MG/5ML PO SUSP: 15.0000 mL | ORAL | Status: DC | PRN

## 2022-02-18 MED ORDER — CEFAZOLIN SODIUM-DEXTROSE 2-4 GM/100ML-% IV SOLN
2.0000 g | INTRAVENOUS | Status: AC
  Administered 2022-02-18: 2 g via INTRAVENOUS
  Filled 2022-02-18: qty 100

## 2022-02-18 MED ORDER — OXYCODONE HCL 5 MG/5ML PO SOLN: 5.0000 mg | Freq: Once | ORAL | Status: DC | PRN

## 2022-02-18 MED ORDER — ROCURONIUM BROMIDE 10 MG/ML (PF) SYRINGE
PREFILLED_SYRINGE | INTRAVENOUS | Status: DC | PRN
  Administered 2022-02-18: 50 mg via INTRAVENOUS

## 2022-02-18 MED ORDER — PROTAMINE SULFATE 10 MG/ML IV SOLN
INTRAVENOUS | Status: DC | PRN
  Administered 2022-02-18: 50 mg via INTRAVENOUS

## 2022-02-18 MED ORDER — HYDRALAZINE HCL 20 MG/ML IJ SOLN: 5.0000 mg | INTRAMUSCULAR | Status: DC | PRN

## 2022-02-18 MED ORDER — EPHEDRINE SULFATE-NACL 50-0.9 MG/10ML-% IV SOSY
PREFILLED_SYRINGE | INTRAVENOUS | Status: DC | PRN
  Administered 2022-02-18: 5 mg via INTRAVENOUS
  Administered 2022-02-18 (×3): 2.5 mg via INTRAVENOUS

## 2022-02-18 MED ORDER — HEPARIN SODIUM (PORCINE) 5000 UNIT/ML IJ SOLN
5000.0000 [IU] | Freq: Three times a day (TID) | INTRAMUSCULAR | Status: DC
Start: 2022-02-18 — End: 2022-02-19
  Administered 2022-02-18 (×2): 5000 [IU] via SUBCUTANEOUS
  Filled 2022-02-18 (×2): qty 1

## 2022-02-18 MED ORDER — IODIXANOL 320 MG/ML IV SOLN
INTRAVENOUS | Status: DC | PRN
  Administered 2022-02-18: 100 mL via INTRA_ARTERIAL

## 2022-02-18 MED ORDER — FENTANYL CITRATE (PF) 250 MCG/5ML IJ SOLN
INTRAMUSCULAR | Status: DC | PRN
  Administered 2022-02-18 (×2): 50 ug via INTRAVENOUS
  Administered 2022-02-18: 25 ug via INTRAVENOUS

## 2022-02-18 MED ORDER — METOPROLOL TARTRATE 5 MG/5ML IV SOLN: 2.0000 mg | INTRAVENOUS | Status: DC | PRN

## 2022-02-18 MED ORDER — GLYCOPYRROLATE PF 0.2 MG/ML IJ SOSY
PREFILLED_SYRINGE | INTRAMUSCULAR | Status: AC
  Filled 2022-02-18: qty 1

## 2022-02-18 MED ORDER — SURGIFLO WITH THROMBIN (HEMOSTATIC MATRIX KIT) OPTIME
TOPICAL | Status: DC | PRN
  Administered 2022-02-18: 1 via TOPICAL

## 2022-02-18 MED ORDER — ASPIRIN 81 MG PO TBEC
81.0000 mg | DELAYED_RELEASE_TABLET | Freq: Every day | ORAL | Status: DC
  Administered 2022-02-19: 81 mg via ORAL
  Filled 2022-02-18: qty 1

## 2022-02-18 MED ORDER — ONDANSETRON HCL 4 MG/2ML IJ SOLN
4.0000 mg | Freq: Four times a day (QID) | INTRAMUSCULAR | Status: DC | PRN
  Administered 2022-02-18 – 2022-02-19 (×2): 4 mg via INTRAVENOUS
  Filled 2022-02-18 (×2): qty 2

## 2022-02-18 MED ORDER — PROTAMINE SULFATE 10 MG/ML IV SOLN
INTRAVENOUS | Status: AC
  Filled 2022-02-18: qty 5

## 2022-02-18 MED ORDER — LABETALOL HCL 5 MG/ML IV SOLN: 10.0000 mg | INTRAVENOUS | Status: DC | PRN

## 2022-02-18 MED ORDER — LIDOCAINE HCL (PF) 1 % IJ SOLN
INTRAMUSCULAR | Status: AC
  Filled 2022-02-18: qty 30

## 2022-02-18 MED ORDER — ALPRAZOLAM 0.25 MG PO TABS
0.2500 mg | ORAL_TABLET | Freq: Two times a day (BID) | ORAL | Status: DC | PRN
Start: 2022-02-18 — End: 2022-02-19

## 2022-02-18 MED ORDER — ACETAMINOPHEN 10 MG/ML IV SOLN: 1000.0000 mg | Freq: Once | INTRAVENOUS | Status: DC | PRN

## 2022-02-18 MED ORDER — PROPOFOL 10 MG/ML IV BOLUS
INTRAVENOUS | Status: AC
  Filled 2022-02-18: qty 20

## 2022-02-18 MED ORDER — PANTOPRAZOLE SODIUM 40 MG PO TBEC
40.0000 mg | DELAYED_RELEASE_TABLET | Freq: Every day | ORAL | Status: DC
  Administered 2022-02-18 – 2022-02-19 (×2): 40 mg via ORAL
  Filled 2022-02-18 (×2): qty 1

## 2022-02-18 MED ORDER — ACETAMINOPHEN 325 MG PO TABS: 325.0000 mg | ORAL_TABLET | ORAL | Status: DC | PRN

## 2022-02-18 MED ORDER — HEPARIN SODIUM (PORCINE) 1000 UNIT/ML IJ SOLN
INTRAMUSCULAR | Status: DC | PRN
  Administered 2022-02-18: 5000 [IU] via INTRAVENOUS

## 2022-02-18 MED ORDER — POTASSIUM CHLORIDE CRYS ER 20 MEQ PO TBCR: 20.0000 meq | EXTENDED_RELEASE_TABLET | Freq: Every day | ORAL | Status: DC | PRN

## 2022-02-18 MED ORDER — SODIUM CHLORIDE 0.9 % IV SOLN
500.0000 mL | Freq: Once | INTRAVENOUS | Status: AC | PRN
Start: 2022-02-18 — End: 2022-02-18
  Administered 2022-02-18: 500 mL via INTRAVENOUS

## 2022-02-18 MED ORDER — OXYCODONE-ACETAMINOPHEN 5-325 MG PO TABS
1.0000 | ORAL_TABLET | ORAL | Status: DC | PRN
  Administered 2022-02-18: 2 via ORAL
  Administered 2022-02-19: 1 via ORAL
  Filled 2022-02-18: qty 1
  Filled 2022-02-18: qty 2

## 2022-02-18 MED ORDER — LACTATED RINGERS IV SOLN: INTRAVENOUS | Status: DC

## 2022-02-18 MED ORDER — AMISULPRIDE (ANTIEMETIC) 5 MG/2ML IV SOLN
INTRAVENOUS | Status: AC
  Filled 2022-02-18: qty 4

## 2022-02-18 MED ORDER — PREGABALIN 25 MG PO CAPS
50.0000 mg | ORAL_CAPSULE | Freq: Two times a day (BID) | ORAL | Status: DC
  Administered 2022-02-18 – 2022-02-19 (×2): 50 mg via ORAL
  Filled 2022-02-18 (×2): qty 2

## 2022-02-18 SURGICAL SUPPLY — 55 items
ADH SKN CLS APL DERMABOND .7 (GAUZE/BANDAGES/DRESSINGS) ×2
AGENT HMST KT MTR STRL THRMB (HEMOSTASIS) ×1
BAG BANDED W/RUBBER/TAPE 36X54 (MISCELLANEOUS) ×1 IMPLANT
BAG COUNTER SPONGE SURGICOUNT (BAG) ×1 IMPLANT
BAG EQP BAND 135X91 W/RBR TAPE (MISCELLANEOUS) ×2
BAG SPNG CNTER NS LX DISP (BAG) ×1
BALLN STERLING RX 6X30X80 (BALLOONS) ×1
BALLOON STERLING RX 6X30X80 (BALLOONS) IMPLANT
CANISTER SUCT 3000ML PPV (MISCELLANEOUS) ×1 IMPLANT
CATH SUCT 10FR WHISTLE TIP (CATHETERS) ×1 IMPLANT
CLIP VESOCCLUDE MED 6/CT (CLIP) ×1 IMPLANT
CLIP VESOCCLUDE SM WIDE 6/CT (CLIP) ×1 IMPLANT
COVER DOME SNAP 22 D (MISCELLANEOUS) ×1 IMPLANT
COVER PROBE W GEL 5X96 (DRAPES) ×1 IMPLANT
DERMABOND ADVANCED (GAUZE/BANDAGES/DRESSINGS) ×2
DERMABOND ADVANCED .7 DNX12 (GAUZE/BANDAGES/DRESSINGS) ×1 IMPLANT
DRAPE FEMORAL ANGIO 80X135IN (DRAPES) ×1 IMPLANT
DRAPE FEMORAL XLNG W WINDOW (DRAPES) IMPLANT
ELECT REM PT RETURN 9FT ADLT (ELECTROSURGICAL) ×1
ELECTRODE REM PT RTRN 9FT ADLT (ELECTROSURGICAL) ×1 IMPLANT
GLOVE SURG SS PI 7.5 STRL IVOR (GLOVE) ×3 IMPLANT
GOWN STRL REUS W/ TWL LRG LVL3 (GOWN DISPOSABLE) ×2 IMPLANT
GOWN STRL REUS W/ TWL XL LVL3 (GOWN DISPOSABLE) ×1 IMPLANT
GOWN STRL REUS W/TWL LRG LVL3 (GOWN DISPOSABLE) ×2
GOWN STRL REUS W/TWL XL LVL3 (GOWN DISPOSABLE) ×1
GUIDEWIRE ENROUTE 0.014 (WIRE) ×1 IMPLANT
HEMOSTAT SNOW SURGICEL 2X4 (HEMOSTASIS) IMPLANT
INTRODUCER KIT GALT 7CM (INTRODUCER) ×1
KIT BASIN OR (CUSTOM PROCEDURE TRAY) ×1 IMPLANT
KIT ENCORE 26 ADVANTAGE (KITS) ×1 IMPLANT
KIT INTRODUCER GALT 7 (INTRODUCER) ×1 IMPLANT
KIT TURNOVER KIT B (KITS) ×1 IMPLANT
NDL HYPO 25GX1X1/2 BEV (NEEDLE) IMPLANT
NEEDLE HYPO 25GX1X1/2 BEV (NEEDLE) IMPLANT
PACK CAROTID (CUSTOM PROCEDURE TRAY) ×1 IMPLANT
PENCIL BUTTON HOLSTER BLD 10FT (ELECTRODE) IMPLANT
POSITIONER HEAD DONUT 9IN (MISCELLANEOUS) ×1 IMPLANT
SET MICROPUNCTURE 5F STIFF (MISCELLANEOUS) IMPLANT
STENT TRANSCAROTID SYSTEM 9X40 (Permanent Stent) IMPLANT
SURGIFLO W/THROMBIN 8M KIT (HEMOSTASIS) IMPLANT
SUT PROLENE 5 0 C 1 24 (SUTURE) ×2 IMPLANT
SUT PROLENE 6 0 BV (SUTURE) IMPLANT
SUT SILK 2 0 PERMA HAND 18 BK (SUTURE) ×1 IMPLANT
SUT SILK 2 0 SH (SUTURE) ×1 IMPLANT
SUT VIC AB 3-0 SH 27 (SUTURE) ×2
SUT VIC AB 3-0 SH 27X BRD (SUTURE) ×2 IMPLANT
SUT VIC AB 4-0 PS2 27 (SUTURE) ×1 IMPLANT
SYR 10ML LL (SYRINGE) ×3 IMPLANT
SYR 20ML LL LF (SYRINGE) ×1 IMPLANT
SYR CONTROL 10ML LL (SYRINGE) IMPLANT
SYSTEM TRANSCAROTID NEUROPRTCT (MISCELLANEOUS) ×1 IMPLANT
TOWEL GREEN STERILE (TOWEL DISPOSABLE) ×1 IMPLANT
TRANSCAROTID NEUROPROTECT SYS (MISCELLANEOUS) ×2
WATER STERILE IRR 1000ML POUR (IV SOLUTION) ×1 IMPLANT
WIRE BENTSON .035X145CM (WIRE) ×1 IMPLANT

## 2022-02-18 NOTE — Op Note (Signed)
Patient name: Wanda Harrington MRN: [id number] DOB: 02-Sep-1950 Sex: female  02/18/2022 Pre-operative Diagnosis: Asymptomatic left carotid stenosis Post-operative diagnosis:  Same Surgeon:  Annamarie Major Assistants:  [place] Bing, PA Procedure:   #1: Left carotid stent (TCAR)   #2: Retrograde flow reversal neuro protection   #3: Ultrasound-guided cannulation of right common femoral vein Anesthesia:  General Blood Loss:  minimal Specimens:  none  Findings: Approximate 80% carotid stenosis, resolved after stenting  Predilation balloon: 6 x 30 Stent:  ENROUTE 9 x 40 Post dilation balloon: 6 x 30 Contrast: 15 cc Dose area: 581.15 Fluoroscopy time: 4.4 minutes Procedure time: 45 minutes Flow reversal time: 10 minutes  Indications: This is a 71 year old female with bilateral high-grade carotid stenosis.  She has previously undergone right-sided TCAR and comes in today for the left  Procedure:  The patient was identified in the holding area and taken to [place] [facility]  The patient was then placed supine on the table. general anesthesia was administered.  The patient was prepped and draped in the usual sterile fashion.  A time out was called and antibiotics were administered.  A PA was necessary to expedite the procedure and assist with technical details.  Ultrasound used to evaluate the right common femoral vein which was widely patent and easily compressible.  A micropuncture needle was then used to cannulate the right common femoral vein under ultrasound guidance.  A Bentson wire was advanced without resistance followed placement of micropuncture sheath.  A Bentson wire was then inserted and the TCAR sheath was inserted.  Attention was then turned towards the left neck.  Ultrasound identified the location of the left carotid artery just above the clavicle.  A transverse incision was made 1 fingerbreadth above the clavicle.  Cautery was used divide subcutaneous tissue and platysma muscle.   Identified the 2 heads of the sternocleidomastoid and developed an avascular plane between the 2 heads of the muscle.  I first identified the internal jugular vein as well as the vagus nerve.  These were protected laterally.  I then visualized the common carotid artery which was disease-free.  It was fully mobilized.  It was encircled with a umbilical tape and a Potts blue vessel loop.  The patient was fully heparinized.  A 5-0 Prolene pursestring suture was placed in the adventitia of the common carotid artery.  The carotid artery was then cannulated with a micropuncture needle and a 018 wire was inserted up to the mark on the wire.  A micropuncture sheath was then inserted to 3 cm.  A contrast injection was then performed to locate the bifurcation which was marked on the screen.  A carotid Amplatz wire was then inserted up to the mark.  The micropuncture sheath was removed and the TCAR sheath was placed.  The wire and dilator were removed and the sheath was sutured to the skin with 2 silk sutures.  The flow reversal tubing was then connected and passive flow reversal was started and confirmed with a saline flush.  The image detector was then rotated to a LAO position and additional images were performed giving [place] adequate exposure of the bifurcation.  A TCAR timeout was then performed.  Active flow reversal was then initiated by tightening the blue vessel loop.  This was confirmed with a saline flush.  Next a 6 x 30 balloon was inserted with a 014 wire.  The wire was easily advanced across the lesion and positioned in the distal internal carotid artery.  The balloon was placed across the lesion and then inflated to 8 atm.  Next, the stent was inserted which was a 9 x 40 ENROUTE stent.  This was then deployed.  I postdilated the lesion with a 6 x 30 balloon.  We waited approximately 4 minutes and then performed angiography which revealed a widely patent internal carotid artery with no residual stenosis.  The wire  was then removed.  Flow reversal was then disconnected and the blood was returned to the femoral sheath.  The sheath in the carotid artery was then removed and the Prolene suture was used to close the arteriotomy site.  Hand-held Doppler was used to evaluate the artery which had excellent signals.  50 mg of protamine was then used to reverse the heparin.  The PA remove the sheath in the right groin and held manual pressure for hemostasis for approximately 15 minutes.  The carotid incision was then irrigated.  Surgi-Flo was placed in the wound.  Once hemostasis was satisfactory, the platysma muscle was closed with 3-0 Vicryl.  The skin was closed with 3-0 Vicryl followed by Dermabond.  The patient was successfully awakened from anesthesia and found to be neurologically intact.  She is taken recovery in stable condition.   Disposition: To PACU stable.   Theotis Burrow, M.D., [facility] [place] Office: [phone] Pager:  [phone]

## 2022-02-18 NOTE — Anesthesia Postprocedure Evaluation (Signed)
Anesthesia Post Note  Patient: Wanda Harrington  Procedure(s) Performed: Left Transcarotid Artery Revascularization Using 9m x 40 mm Transcarotid Stent (Left: Neck)     Patient location during evaluation: PACU Anesthesia Type: General Level of consciousness: awake and alert Pain management: pain level controlled Vital Signs Assessment: post-procedure vital signs reviewed and stable Respiratory status: spontaneous breathing, nonlabored ventilation, respiratory function stable and patient connected to nasal cannula oxygen Cardiovascular status: blood pressure returned to baseline and stable Postop Assessment: no apparent nausea or vomiting Anesthetic complications: no   No notable events documented.  Last Vitals:  Vitals:   02/18/22 1400 02/18/22 1443  BP: (!) 83/48 95/71  Pulse: 62 62  Resp: (!) 24 19  Temp:    SpO2: 97% 96%    Last Pain:  Vitals:   02/18/22 1339  TempSrc:   PainSc: Asleep                 KEffie Berkshire

## 2022-02-18 NOTE — Anesthesia Procedure Notes (Signed)
Procedure Name: Intubation Date/Time: 02/18/2022 7:57 AM  Performed by: Rande Brunt, CRNAPre-anesthesia Checklist: Patient identified, Emergency Drugs available, Suction available and Patient being monitored Patient Re-evaluated:Patient Re-evaluated prior to induction Oxygen Delivery Method: Circle System Utilized Preoxygenation: Pre-oxygenation with 100% oxygen Induction Type: IV induction Ventilation: Mask ventilation without difficulty and Oral airway inserted - appropriate to patient size Laryngoscope Size: Sabra Heck and 2 Grade View: Grade I Tube type: Oral Number of attempts: 1 Airway Equipment and Method: Stylet and Oral airway Placement Confirmation: ETT inserted through vocal cords under direct vision, positive ETCO2 and breath sounds checked- equal and bilateral Secured at: 22 cm Tube secured with: Tape Dental Injury: Teeth and Oropharynx as per pre-operative assessment  Comments: Inserted by Laurence Spates

## 2022-02-18 NOTE — Anesthesia Procedure Notes (Signed)
Arterial Line Insertion Start/End8/23/2023 8:00 AM, 02/18/2022 8:03 AM Performed by: Effie Berkshire, MD, anesthesiologist  Patient location: Pre-op. Preanesthetic checklist: patient identified, IV checked, site marked, risks and benefits discussed, surgical consent, monitors and equipment checked, pre-op evaluation, timeout performed and anesthesia consent Lidocaine 1% used for infiltration Right, radial was placed Catheter size: 20 G Hand hygiene performed  and maximum sterile barriers used   Attempts: 2 Procedure performed without using ultrasound guided technique. Following insertion, dressing applied and Biopatch. Post procedure assessment: normal and unchanged  Post procedure complications: second provider assisted. Patient tolerated the procedure well with no immediate complications. Additional procedure comments: SRNA x1, MDA x1.

## 2022-02-18 NOTE — H&P (Signed)
Patient name: Wanda Harrington            MRN: [id number]        DOB: 1950/11/27          Sex: female   REASON FOR VISIT:       Postop    HISTORY OF PRESENT ILLNESS:    Wanda Harrington is a 71 y.o. female, who is referred for evaluation of bilateral carotid stenosis.  In June 2021 she developed left hand weakness.  Over time, this progressed to where she cannot raise her arm over her head.  She has had difficulty with use of her left hand.  She has overall generalized weakness especially in the left leg.  She was seen by neurology in [place].  At that time it was reported that she had left arm focal neuropathy.  She was referred to orthopedic surgery for potential decompression.  In February 2023 she had an MRI that showed a right frontal posterior cortical ischemic infarct.  Carotid duplex showed bilateral carotid stenosis.  She had a TEE that was normal.  I sent her for a CT scan to better define her anatomy.  This revealed bilateral high-grade carotid stenosis.  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  Therefore on 11/21/2021 she underwent a right sided TCAR.  Intraoperative findings included a 98% stenosis which resolved after stenting.  She had a slight increase in her creatinine on postoperative day #1 which improved 1 day #2.  She was discharged home.   She did have some bruising in her left arm but that is resolved.  She has yet to get her creatinine rechecked  The patient is medically managed for hypertension.  She is on a statin for hypercholesterolemia.  She continues to smoke.  She also complains of leg pain with walking.  She does not have any open wounds   CURRENT MEDICATIONS:            Current Outpatient Medications  Medication Sig Dispense Refill   ALPRAZolam (XANAX) 0.25 MG tablet Take 0.25 mg by mouth 2 (two) times daily as needed for anxiety.       aluminum hydroxide-magnesium carbonate (GAVISCON) 95-358 MG/15ML SUSP  Take 15 mLs by mouth as needed.       anastrozole (ARIMIDEX) 1 MG tablet Take 1 tablet (1 mg total) by mouth daily. 30 tablet 3   aspirin EC 81 MG tablet Take 81 mg by mouth daily. Swallow whole.       atorvastatin (LIPITOR) 80 MG tablet Take 1 tablet (80 mg total) by mouth daily. 90 tablet 3   cholecalciferol (VITAMIN D3) 25 MCG (1000 UNIT) tablet Take 1,000 Units by mouth daily.       clopidogrel (PLAVIX) 75 MG tablet Take 1 tablet (75 mg total) by mouth daily. 30 tablet 6   ezetimibe (ZETIA) 10 MG tablet Take 1 tablet (10 mg total) by mouth daily. 90 tablet 4   fluticasone (FLONASE) 50 MCG/ACT nasal spray Place 1 spray into both nostrils 2 (two) times daily as needed for allergies.       loratadine (CLARITIN) 10 MG tablet Take 10 mg by mouth daily as needed for allergies.       OVER THE COUNTER MEDICATION Smoke Medical CBD couple time a day       polyethylene glycol (MIRALAX / GLYCOLAX) 17 g packet Take 17 g by mouth daily as needed for moderate constipation.  pregabalin (LYRICA) 50 MG capsule Take 50 mg by mouth 2 (two) times daily.       ondansetron (ZOFRAN) 4 MG tablet Take 1 tablet (4 mg total) by mouth every 8 (eight) hours as needed for nausea or vomiting. (Patient not taking: Reported on 12/22/2021) 10 tablet 0   oxyCODONE-acetaminophen (PERCOCET) 5-325 MG tablet Take 1 tablet by mouth every 6 (six) hours as needed for severe pain. (Patient not taking: Reported on 12/22/2021) 8 tablet 0    No current facility-administered medications for this visit.      REVIEW OF SYSTEMS:    '[X]'$  denotes positive finding, '[ ]'$  denotes negative finding Cardiac   Comments:  Chest pain or chest pressure:      Shortness of breath upon exertion:      Short of breath when lying flat:      Irregular heart rhythm:      Constitutional      Fever or chills:          PHYSICAL EXAM:       Vitals:    12/22/21 1109  BP: (!) 151/79  Pulse: 80  Resp: 14  Temp: 97.8 F (36.6 C)  TempSrc: Temporal   SpO2: 94%  Weight: 96 lb (43.5 kg)  Height: 5' (1.524 m)      GENERAL: The patient is a well-nourished female, in no acute distress. The vital signs are documented above. CARDIOVASCULAR: There is a regular rate and rhythm. PULMONARY: Non-labored respirations Incision is well-healed   STUDIES:    Carotid ultrasound:   Right Carotid: Patent stent with no evidence for restenosis.   Left Carotid: Velocities in the left ICA are consistent with a 60-79%  stenosis.    MEDICAL ISSUES:    Carotid: Right carotid stent is widely patent.  Persistent high-grade left carotid stenosis, evaluated as a string sign by CT scan.  I discussed that she has recovered nicely from the right side and we should discuss treating the left side.  She is going to let her daughter recover from cataract surgery and then we will plan on doing left-sided TCAR in August.  She will continue on dual antiplatelet therapy.  From my perspective she has a port in place that has been flushed and maintained.  This can be utilized during her hospital stay for her left TCAR.  Elevated creatinine.  She had a slight increase in creatinine postprocedure which trended down.  She is yet to get a follow-up creatinine because of the bruising in her left arm and the fact that she cannot have access in her right arm.  She is going to get this scheduled in the near future.   Leia Alf, MD, FACS [facility] [phone] Pager [phone]

## 2022-02-18 NOTE — Progress Notes (Signed)
IVT consulted for port access.  Upon arrival, pt in OR.

## 2022-02-18 NOTE — Anesthesia Preprocedure Evaluation (Addendum)
Anesthesia Evaluation  Patient identified by MRN, date of birth, ID band Patient awake    Reviewed: Allergy & Precautions, NPO status , Patient's Chart, lab work & pertinent test results  Airway Mallampati: II  TM Distance: >3 FB Neck ROM: Full    Dental  (+) Edentulous Upper, Missing, Dental Advisory Given   Pulmonary Current Smoker and Patient abstained from smoking.,    breath sounds clear to auscultation       Cardiovascular hypertension,  Rhythm:Regular Rate:Normal  Echo: 1. Left ventricular ejection fraction, by estimation, is 55 to 60%. The  left ventricle has normal function. The left ventricle has no regional  wall motion abnormalities. Left ventricular diastolic parameters were  normal.  2. Right ventricular systolic function is normal. The right ventricular  size is normal.  3. The mitral valve is normal in structure. No evidence of mitral valve  regurgitation.  4. The aortic valve is tricuspid. Aortic valve regurgitation is not  visualized. Aortic valve sclerosis is present, with no evidence of aortic  valve stenosis.    Neuro/Psych PSYCHIATRIC DISORDERS Anxiety Depression  Neuromuscular disease CVA    GI/Hepatic negative GI ROS, (+) Hepatitis -, C  Endo/Other  negative endocrine ROS  Renal/GU negative Renal ROS     Musculoskeletal negative musculoskeletal ROS (+)   Abdominal Normal abdominal exam  (+)   Peds  Hematology negative hematology ROS (+)   Anesthesia Other Findings   Reproductive/Obstetrics                            Anesthesia Physical Anesthesia Plan  ASA: 4  Anesthesia Plan: General   Post-op Pain Management:    Induction: Intravenous  PONV Risk Score and Plan: 3 and Ondansetron, Dexamethasone and Midazolam  Airway Management Planned: Oral ETT  Additional Equipment: Arterial line  Intra-op Plan:   Post-operative Plan: Extubation in  OR  Informed Consent: I have reviewed the patients History and Physical, chart, labs and discussed the procedure including the risks, benefits and alternatives for the proposed anesthesia with the patient or authorized representative who has indicated his/her understanding and acceptance.     Dental advisory given  Plan Discussed with: CRNA  Anesthesia Plan Comments:        Anesthesia Quick Evaluation

## 2022-02-18 NOTE — Transfer of Care (Signed)
Immediate Anesthesia Transfer of Care Note  Patient: Wanda Harrington  Procedure(s) Performed: Left Transcarotid Artery Revascularization Using 69m x 40 mm Transcarotid Stent (Left: Neck)  Patient Location: PACU  Anesthesia Type:General  Level of Consciousness: awake, alert  and patient cooperative  Airway & Oxygen Therapy: Patient Spontanous Breathing and Patient connected to nasal cannula oxygen  Post-op Assessment: Report given to RN, Post -op Vital signs reviewed and stable and Patient moving all extremities X 4  Post vital signs: Reviewed and stable  Last Vitals:  Vitals Value Taken Time  BP 97/47 02/18/22 0945  Temp    Pulse 70 02/18/22 0946  Resp 15 02/18/22 0946  SpO2 89 % 02/18/22 0946  Vitals shown include unvalidated device data.  Last Pain:  Vitals:   02/18/22 0627  TempSrc:   PainSc: 0-No pain         Complications: No notable events documented.

## 2022-02-19 ENCOUNTER — Encounter (HOSPITAL_COMMUNITY): Payer: Self-pay | Admitting: Surgery

## 2022-02-19 ENCOUNTER — Other Ambulatory Visit (HOSPITAL_COMMUNITY): Payer: Self-pay

## 2022-02-19 LAB — BASIC METABOLIC PANEL
Anion gap: 9 (ref 5–15)
BUN: 24 mg/dL — ABNORMAL HIGH (ref 8–23)
CO2: 21 mmol/L — ABNORMAL LOW (ref 22–32)
Calcium: 8.1 mg/dL — ABNORMAL LOW (ref 8.9–10.3)
Chloride: 104 mmol/L (ref 98–111)
Creatinine, Ser: 1.67 mg/dL — ABNORMAL HIGH (ref 0.44–1.00)
GFR, Estimated: 33 mL/min — ABNORMAL LOW (ref >60)
Glucose, Bld: 135 mg/dL — ABNORMAL HIGH (ref 70–99)
Potassium: 5.1 mmol/L (ref 3.5–5.1)
Sodium: 134 mmol/L — ABNORMAL LOW (ref 135–145)

## 2022-02-19 LAB — CBC
HCT: 34.1 % — ABNORMAL LOW (ref 36.0–46.0)
Hemoglobin: 10.9 g/dL — ABNORMAL LOW (ref 12.0–15.0)
MCH: 31.1 pg (ref 26.0–34.0)
MCHC: 32 g/dL (ref 30.0–36.0)
MCV: 97.2 fL (ref 80.0–100.0)
Platelets: 157 10*3/uL (ref 150–400)
RBC: 3.51 MIL/uL — ABNORMAL LOW (ref 3.87–5.11)
RDW: 12.8 % (ref 11.5–15.5)
WBC: 6.7 10*3/uL (ref 4.0–10.5)
nRBC: 0 % (ref 0.0–0.2)

## 2022-02-19 LAB — LIPID PANEL
Cholesterol: 103 mg/dL (ref 0–200)
HDL: 39 mg/dL — ABNORMAL LOW (ref >40)
LDL Cholesterol: 59 mg/dL (ref 0–99)
Total CHOL/HDL Ratio: 2.6 RATIO
Triglycerides: 27 mg/dL (ref <150)
VLDL: 5 mg/dL (ref 0–40)

## 2022-02-19 MED ORDER — ONDANSETRON HCL 4 MG PO TABS
4.0000 mg | ORAL_TABLET | Freq: Every day | ORAL | 1 refills | Status: AC | PRN
  Filled 2022-02-19: qty 30, 30d supply, fill #0

## 2022-02-19 MED ORDER — DOCUSATE SODIUM 100 MG PO CAPS
100.0000 mg | ORAL_CAPSULE | Freq: Every day | ORAL | 0 refills | Status: AC
  Filled 2022-02-19: qty 20, 20d supply, fill #0

## 2022-02-19 MED ORDER — OXYCODONE-ACETAMINOPHEN 5-325 MG PO TABS
1.0000 | ORAL_TABLET | Freq: Four times a day (QID) | ORAL | 0 refills | Status: AC | PRN
  Filled 2022-02-19: qty 20, 5d supply, fill #0

## 2022-02-19 NOTE — Discharge Instructions (Signed)
   [facility]  Discharge Instructions   Carotid Endarterectomy (CEA)  Please refer to the following instructions for your post-procedure care. Your surgeon or physician assistant will discuss any changes with you.  Activity  You are encouraged to walk as much as you can. You can slowly return to normal activities but must avoid strenuous activity and heavy lifting until your doctor tell you it's OK. Avoid activities such as vacuuming or swinging a golf club. You can drive after one week if you are comfortable and you are no longer taking prescription pain medications. It is normal to feel tired for serval weeks after your surgery. It is also normal to have difficulty with sleep habits, eating, and bowel movements after surgery. These will go away with time.  Bathing/Showering  You may shower after you come home. Do not soak in a bathtub, hot tub, or swim until the incision heals completely.  Incision Care  Shower every day. Clean your incision with mild soap and water. Pat the area dry with a clean towel. You do not need a bandage unless otherwise instructed. Do not apply any ointments or creams to your incision. You may have skin glue on your incision. Do not peel it off. It will come off on its own in about one week. Your incision may feel thickened and raised for several weeks after your surgery. This is normal and the skin will soften over time. For Men Only: It's OK to shave around the incision but do not shave the incision itself for 2 weeks. It is common to have numbness under your chin that could last for several months.  Diet  Resume your normal diet. There are no special food restrictions following this procedure. A low fat/low cholesterol diet is recommended for all patients with vascular disease. In order to heal from your surgery, it is CRITICAL to get adequate nutrition. Your body requires vitamins, minerals, and protein. Vegetables are the best  source of vitamins and minerals. Vegetables also provide the perfect balance of protein. Processed food has little nutritional value, so try to avoid this.        Medications  Resume taking all of your medications unless your doctor or physician assistant tells you not to. If your incision is causing pain, you may take over-the- counter pain relievers such as acetaminophen (Tylenol). If you were prescribed a stronger pain medication, please be aware these medications can cause nausea and constipation. Prevent nausea by taking the medication with a snack or meal. Avoid constipation by drinking plenty of fluids and eating foods with a high amount of fiber, such as fruits, vegetables, and grains. Do not take Tylenol if you are taking prescription pain medications.  Follow Up  Our office will schedule a follow up appointment 2-3 weeks following discharge.  Please call us immediately for any of the following conditions  Increased pain, redness, drainage (pus) from your incision site. Fever of 101 degrees or higher. If you should develop stroke (slurred speech, difficulty swallowing, weakness on one side of your body, loss of vision) you should call 911 and go to the nearest emergency room.  Reduce your risk of vascular disease:  Stop smoking. If you would like help call [facility] at 1-800-QUIT-NOW (1-800-784-8669) or [facility] at 336-586-4000. Manage your cholesterol Maintain a desired weight Control your diabetes Keep your blood pressure down  If you have any questions, please call the office at [phone].   

## 2022-02-19 NOTE — Progress Notes (Addendum)
Mobility Specialist: Progress Note   02/19/22 1325  Mobility  Activity Ambulated with assistance in hallway  Level of Assistance Contact guard assist, steadying assist  Assistive Device None  Distance Ambulated (ft) 320 ft  Activity Response Tolerated well  $Mobility charge 1 Mobility   Pre-Mobility: 68 HR, 93% SpO2 During Mobility: 90%SpO2 Post-Mobility: 74 HR, 87% > 93% SpO2  Pt received in the bed and agreeable to mobility. Ambulated on 3 L/min [place]. C/o mild SOB during ambulation, otherwise asymptomatic. Desat to 87% after returning to the room. Pt coached through pursed lip breathing with sats improving to 93%, RN aware. Pt back in bed with call bell and phone at her side.   [facility] Wanda Harrington Mobility Specialist Mobility Specialist [address] [place]: [phone]

## 2022-02-19 NOTE — TOC Transition Note (Signed)
Transition of Care (TOC) - CM/SW Discharge Note Marvetta Gibbons RN, BSN Transitions of Care Unit 4E- RN Case Manager See Treatment Team for direct phone #    Patient Details  Name: Wanda Harrington MRN: [id number] Date of Birth: 02-13-1951  Transition of Care [facility]) CM/SW Contact:  Dawayne Patricia, RN Phone Number: 02/19/2022, 2:55 PM   Clinical Narrative:    Pt stable for transition home today, Has transportation set up for 330pm per bedside RN.  Notified by bedside RN that pt will need home 02- no DME orders noted.  Paged PA- M. Collins and spoke with her about DME need- verbal orders given for home 02 3L-  CM spoke with pt regarding home 02 need- pt is not happy about needing home 02 but agreeable- her main concern is being ready to go when her ride gets here- no preference for DME agency as long as what she needs is here by the time her ride is.  Call made to [employer] w/ [employer]- per Brenton Grills he can have portable 02 here within 30 min for transport home. Home 02 referral given to [employer] for home 02 needs.   [employer] to deliver portable tank to room prior to pt discharge.   No further TOC needs noted.    Final next level of care: Home/Self Care Barriers to Discharge: No Barriers Identified   Patient Goals and CMS Choice Patient states their goals for this hospitalization and ongoing recovery are:: return home CMS Medicare.gov Compare Post Acute Care list provided to:: Patient Choice offered to / list presented to : Patient  Discharge Placement               Home        Discharge Plan and Services   Discharge Planning Services: CM Consult Post Acute Care Choice: Durable Medical Equipment          DME Arranged: Oxygen DME Agency: [employer] Date DME Agency Contacted: 02/19/22 Time DME Agency Contacted: 1025 Representative spoke with at DME Agency: Brenton Grills HH Arranged: NA [place] Agency: NA        Social Determinants of Health ([place]) Interventions      Readmission Risk Interventions    02/19/2022    2:54 PM  Readmission Risk Prevention Plan  Transportation Screening Complete  PCP or Specialist Appt within 5-7 Days Not Complete  Not Complete comments Per surgeon  Home Care Screening Complete  Medication Review (RN CM) Complete

## 2022-02-19 NOTE — Progress Notes (Signed)
SATURATION QUALIFICATIONS: (This note is used to comply with regulatory documentation for home oxygen)  Patient Saturations on Room Air at Rest = 85%  Patient Saturations on Room Air while Ambulating = 76%  Patient Saturations on 3 Liters of oxygen while Ambulating = 90%  Please briefly explain why patient needs home oxygen: oxygen drops during activity

## 2022-02-19 NOTE — Progress Notes (Addendum)
[facility] that they used her right UE for IV s/p mastectomy.   Objective (!) 99/50 (!) 46 98.1 F (36.7 C) (Oral) 18 91%  Intake/Output Summary (Last 24 hours) at 02/19/2022 0740 Last data filed at 02/18/2022 1623 Gross per 24 hour  Intake 850 ml  Output 120 ml  Net 730 ml    Left neck incision with bloody drainage dry dressing replaced Right groin soft  Lungs non labored, speech clear  Moving all 4 ext.    Assessment/Planning: Asymptomatic high grade left ICA stenosis POD #1 TCAR left ICA with right common femoral vein flow reversal.  She has voided, tolerating PO's.  Pending ambulation today Possible discharge later today to go home. Dry dressing to neck incision as needed for bloody drainage No neurologic deficits, no facial droop or tongue deviation on exam. F/u in 2 weeks our office will call  Roxy Horseman 02/19/2022 7:40 AM --  Laboratory Lab Results: Recent Labs    02/18/22 1302 02/19/22 0408  WBC 6.2 6.7  HGB 11.2* 10.9*  HCT 33.7* 34.1*  PLT 142* 157   BMET Recent Labs    02/18/22 1302 02/19/22 0408  NA  --  134*  K  --  5.1  CL  --  104  CO2  --  21*  GLUCOSE  --  135*  BUN  --  24*  CREATININE 1.10* 1.67*  CALCIUM  --  8.1*    COAG Lab Results  Component Value Date   INR 1.1 02/12/2022   INR 1.0 11/17/2021   No results found for: "PTT"

## 2022-02-20 ENCOUNTER — Emergency Department (HOSPITAL_COMMUNITY)
Admission: EM | Admit: 2022-02-20 | Discharge: 2022-02-20 | Disposition: A | Discharge status: Home / Self Care | Payer: Medicare Other | Attending: Emergency Medicine | Admitting: Emergency Medicine

## 2022-02-20 ENCOUNTER — Encounter (HOSPITAL_COMMUNITY): Payer: Self-pay

## 2022-02-20 ENCOUNTER — Other Ambulatory Visit: Payer: Self-pay

## 2022-02-20 ENCOUNTER — Telehealth: Payer: Self-pay

## 2022-02-20 DIAGNOSIS — Z91040 Latex allergy status: Secondary | ICD-10-CM | POA: Insufficient documentation

## 2022-02-20 DIAGNOSIS — Z7901 Long term (current) use of anticoagulants: Secondary | ICD-10-CM | POA: Diagnosis not present

## 2022-02-20 DIAGNOSIS — L7622 Postprocedural hemorrhage and hematoma of skin and subcutaneous tissue following other procedure: Secondary | ICD-10-CM | POA: Insufficient documentation

## 2022-02-20 DIAGNOSIS — T148XXA Other injury of unspecified body region, initial encounter: Secondary | ICD-10-CM

## 2022-02-20 DIAGNOSIS — Z7982 Long term (current) use of aspirin: Secondary | ICD-10-CM | POA: Diagnosis not present

## 2022-02-20 DIAGNOSIS — Z79899 Other long term (current) drug therapy: Secondary | ICD-10-CM | POA: Diagnosis not present

## 2022-02-20 MED ORDER — LIDOCAINE-EPINEPHRINE (PF) 2 %-1:200000 IJ SOLN
INTRAMUSCULAR | Status: AC
  Administered 2022-02-20: 20 mL
  Filled 2022-02-20: qty 20

## 2022-02-20 NOTE — ED Notes (Signed)
Pt roommate Manuela Schwartz notified of pt pending d/c home. Manuela Schwartz states she will be able to pick up pt about 5:30. PT aware. No bleeding noted to surgical site on pt left neck.

## 2022-02-20 NOTE — Consult Note (Signed)
Patient name: Wanda Harrington MRN: [id number] DOB: Oct 30, 1950 Sex: female    HISTORY OF PRESENT ILLNESS:   Wanda Harrington is a 71 y.o. female who is 2 days out from a left sided TCAR for asymptomatic stenosis.  She has been having bleeding from her incision.  She denies any neck swelling.  She was brought to the emergency department by EMS.  CURRENT MEDICATIONS:    No current facility-administered medications for this encounter.   Current Outpatient Medications  Medication Sig Dispense Refill   acetaminophen (TYLENOL) 325 MG tablet Take 650 mg by mouth every 6 (six) hours as needed for moderate pain.     ALPRAZolam (XANAX) 0.25 MG tablet Take 0.25 mg by mouth 2 (two) times daily as needed for anxiety.     anastrozole (ARIMIDEX) 1 MG tablet Take 1 tablet (1 mg total) by mouth daily. 30 tablet 3   aspirin EC 81 MG tablet Take 81 mg by mouth daily. Swallow whole.     atorvastatin (LIPITOR) 80 MG tablet Take 1 tablet (80 mg total) by mouth daily. 90 tablet 3   cetirizine (ZYRTEC) 10 MG tablet Take 10 mg by mouth daily as needed for allergies.     cholecalciferol (VITAMIN D3) 25 MCG (1000 UNIT) tablet Take 1,000 Units by mouth daily.     clopidogrel (PLAVIX) 75 MG tablet Take 1 tablet (75 mg total) by mouth daily. 30 tablet 6   docusate sodium (COLACE) 100 MG capsule Take 1 capsule (100 mg total) by mouth daily. 20 capsule 0   ezetimibe (ZETIA) 10 MG tablet Take 1 tablet (10 mg total) by mouth daily. 90 tablet 4   fluticasone (FLONASE) 50 MCG/ACT nasal spray Place 1 spray into both nostrils daily.     loratadine (CLARITIN) 10 MG tablet Take 10 mg by mouth daily as needed for allergies.     ondansetron (ZOFRAN) 4 MG tablet Take 1 tablet (4 mg total) by mouth every 8 (eight) hours as needed for nausea or vomiting. (Patient not taking: Reported on 12/22/2021) 10 tablet 0   ondansetron (ZOFRAN) 4 MG tablet Take 1 tablet (4 mg total) by mouth daily as needed for  nausea or vomiting. 30 tablet 1   oxyCODONE-acetaminophen (PERCOCET) 5-325 MG tablet Take 1 tablet by mouth every 6 (six) hours as needed for severe pain. (Patient not taking: Reported on 12/22/2021) 8 tablet 0   oxyCODONE-acetaminophen (PERCOCET/ROXICET) 5-325 MG tablet Take 1 tablet by mouth every 6 (six) hours as needed for moderate pain. 20 tablet 0   polyethylene glycol (MIRALAX / GLYCOLAX) 17 g packet Take 17 g by mouth daily as needed for moderate constipation.     pregabalin (LYRICA) 50 MG capsule Take 50 mg by mouth 2 (two) times daily.     sulfamethoxazole-trimethoprim (BACTRIM DS) 800-160 MG tablet Take 1 tablet by mouth 2 (two) times daily. 6 tablet 0    REVIEW OF SYSTEMS:   '[X]'$  denotes positive finding, '[ ]'$  denotes negative finding Cardiac  Comments:  Chest pain or chest pressure: =   Shortness of breath upon exertion:    Short of breath when lying flat:    Irregular heart rhythm:    Constitutional    Fever or chills:      PHYSICAL EXAM:   Vitals:   02/20/22 1300 02/20/22 1417 02/20/22 1430 02/20/22 1530  BP: (!) 124/53 107/85 (!) 107/57 (!) 112/59  Pulse: 63 60 67 61  Resp: '20 19 19 '$ (!) 21  Temp:  TempSrc:      SpO2: 96% 97% 96% 95%  Weight:      Height:        GENERAL: The patient is a well-nourished female, in no acute distress. The vital signs are documented above. CARDIOVASCULAR: There is a regular rate and rhythm. PULMONARY: Non-labored respirations Superficial skin oozing from her incision  STUDIES:      MEDICAL ISSUES:   There is no obvious hematoma.  She does not have airway compromise or swallowing difficulty.  Therefore felt this was most likely just a skin bleed.  The Dermabond was removed.  Manual pressure was held for 15 minutes.  Next, the left supraclavicular incision was cleaned with Betadine.  2% lidocaine was infiltrated all around the skin incision and then Dermabond was reapplied.  Patient is going to be observed for another hour or 2  and then I think it would be safe for her to go home.  I did offer her the option of staying but she wants to spend the night in her room.  Leia Alf, MD, FACS [facility] [phone] Pager [phone]

## 2022-02-20 NOTE — ED Notes (Signed)
Pt assisted to get dressed, all monitoring equipment removed. Pt dressing to left neck clean dry and intact. Pt assisted to room 44. Report given and care endorsed to RN.

## 2022-02-20 NOTE — ED Provider Notes (Signed)
[facility] EMERGENCY DEPARTMENT Provider Note   CSN: [id number] Arrival date & time: 02/20/22  1158     History  Chief Complaint  Patient presents with   Post-op Problem    Wanda Harrington is a 71 y.o. female.  The history is provided by the patient and medical records. No language interpreter was used.  Wound Check This is a new problem. The current episode started 2 days ago. The problem occurs constantly. Pertinent negatives include no chest pain, no abdominal pain, no headaches and no shortness of breath. Nothing aggravates the symptoms. Nothing relieves the symptoms. She has tried nothing for the symptoms. The treatment provided no relief.       Home Medications Prior to Admission medications   Medication Sig Start Date End Date Taking? Authorizing Provider  acetaminophen (TYLENOL) 325 MG tablet Take 650 mg by mouth every 6 (six) hours as needed for moderate pain.    [provider]  ALPRAZolam Duanne Moron) 0.25 MG tablet Take 0.25 mg by mouth 2 (two) times daily as needed for anxiety. 06/28/21   [provider]  anastrozole (ARIMIDEX) 1 MG tablet Take 1 tablet (1 mg total) by mouth daily. 07/31/21   Benay Pike, MD  aspirin EC 81 MG tablet Take 81 mg by mouth daily. Swallow whole.    [provider]  atorvastatin (LIPITOR) 80 MG tablet Take 1 tablet (80 mg total) by mouth daily. 09/08/21   Frann Rider, NP  cetirizine (ZYRTEC) 10 MG tablet Take 10 mg by mouth daily as needed for allergies.    [provider]  cholecalciferol (VITAMIN D3) 25 MCG (1000 UNIT) tablet Take 1,000 Units by mouth daily.    [provider]  clopidogrel (PLAVIX) 75 MG tablet Take 1 tablet (75 mg total) by mouth daily. 11/03/21   Serafina Mitchell, MD  docusate sodium (COLACE) 100 MG capsule Take 1 capsule (100 mg total) by mouth daily. 02/20/22   Ulyses Amor, PA-C  ezetimibe (ZETIA) 10 MG tablet Take 1 tablet (10 mg total) by mouth daily.  11/23/21   Rhyne, Hulen Shouts, PA-C  fluticasone (FLONASE) 50 MCG/ACT nasal spray Place 1 spray into both nostrils daily.    [provider]  loratadine (CLARITIN) 10 MG tablet Take 10 mg by mouth daily as needed for allergies.    [provider]  ondansetron (ZOFRAN) 4 MG tablet Take 1 tablet (4 mg total) by mouth every 8 (eight) hours as needed for nausea or vomiting. Patient not taking: Reported on 12/22/2021 11/23/21   Gabriel Earing, PA-C  ondansetron (ZOFRAN) 4 MG tablet Take 1 tablet (4 mg total) by mouth daily as needed for nausea or vomiting. 02/19/22   Ulyses Amor, PA-C  oxyCODONE-acetaminophen (PERCOCET) 5-325 MG tablet Take 1 tablet by mouth every 6 (six) hours as needed for severe pain. Patient not taking: Reported on 12/22/2021 11/23/21   Gabriel Earing, PA-C  oxyCODONE-acetaminophen (PERCOCET/ROXICET) 5-325 MG tablet Take 1 tablet by mouth every 6 (six) hours as needed for moderate pain. 02/19/22   Ulyses Amor, PA-C  polyethylene glycol (MIRALAX / GLYCOLAX) 17 g packet Take 17 g by mouth daily as needed for moderate constipation.    [provider]  pregabalin (LYRICA) 50 MG capsule Take 50 mg by mouth 2 (two) times daily.    [provider]  sulfamethoxazole-trimethoprim (BACTRIM DS) 800-160 MG tablet Take 1 tablet by mouth 2 (two) times daily. 02/16/22   Serafina Mitchell, MD  Allergies    Motrin [ibuprofen], Other, Aleve [naproxen], and Latex    Review of Systems   Review of Systems  Constitutional:  Negative for chills, fatigue and fever.  HENT:  Negative for congestion.   Respiratory:  Negative for cough, chest tightness, shortness of breath and wheezing.   Cardiovascular:  Negative for chest pain and palpitations.  Gastrointestinal:  Negative for abdominal pain, nausea and vomiting.  Genitourinary:  Negative for dysuria and flank pain.  Musculoskeletal:  Negative for back pain, neck pain and neck stiffness.  Skin:  Positive  for wound.  Neurological:  Negative for dizziness, weakness, light-headedness and headaches.  Psychiatric/Behavioral:  Negative for agitation and confusion.   All other systems reviewed and are negative.   Physical Exam Updated Vital Signs BP (!) 113/54 (BP Location: Left Arm)   Pulse (!) 57   Temp (!) 97.5 F (36.4 C) (Oral)   Resp (!) 22   Ht '5\' 1"'$  (1.549 m)   Wt 50.3 kg   SpO2 97%   BMI 20.97 kg/m  Physical Exam Vitals and nursing note reviewed.  Constitutional:      General: She is not in acute distress.    Appearance: She is well-developed. She is not ill-appearing, toxic-appearing or diaphoretic.  HENT:     Head: Normocephalic and atraumatic.     Nose: Nose normal.     Mouth/Throat:     Mouth: Mucous membranes are moist.  Eyes:     Conjunctiva/sclera: Conjunctivae normal.  Neck:      Comments: Wound on L neck with some clots and slow oozing blood Cardiovascular:     Rate and Rhythm: Normal rate and regular rhythm.     Heart sounds: No murmur heard. Pulmonary:     Effort: Pulmonary effort is normal. No respiratory distress.     Breath sounds: Normal breath sounds.  Abdominal:     Palpations: Abdomen is soft.     Tenderness: There is no abdominal tenderness.  Musculoskeletal:        General: No swelling or tenderness.     Cervical back: No tenderness. No pain with movement, spinous process tenderness or muscular tenderness.  Skin:    General: Skin is warm and dry.     Capillary Refill: Capillary refill takes less than 2 seconds.     Findings: No erythema.  Neurological:     Mental Status: She is alert.  Psychiatric:        Mood and Affect: Mood normal.     ED Results / Procedures / Treatments   Labs (all labs ordered are listed, but only abnormal results are displayed) Labs Reviewed - No data to display  EKG None  Radiology No results found.  Procedures Procedures    Medications Ordered in ED Medications  lidocaine-EPINEPHrine (XYLOCAINE  W/EPI) 2 %-1:200000 (PF) injection (20 mLs  Given by Other 02/20/22 1358)    ED Course/ Medical Decision Making/ A&P                           Medical Decision Making   [name] is a 71 y.o. female with a past medical history significant for anxiety, stroke, cancer, hypercholesterolemia, and recent carotid surgery 2 days ago on her left neck who presents with bleeding.  Patient says that she was discharged yesterday and there was still some bleeding but it was rebandaged and she was discharged home.  She said that overnight it bled and soaked her  pillowcase and closed in sheet and so she presents for evaluation.  She reports no new pain or difficulty breathing.  No fevers or chills.  No reported redness otherwise.  She denies any trauma.  She wanted to make sure her wound was okay.  On my exam, there is clot overlying the wound that does appear to slightly dehisced.  No tenderness or crepitance.  No erythema.  Lungs clear and chest nontender.  Neck otherwise nontender.  Spoke with Dr. Stephens Shire vascular surgery team who came to the bedside and managed it.  They recommended monitor for an hour and if it did not bleed further she would be safer discharge home.  He did not bleed for over an hour and it was rebandaged.  Patient will follow-up with them in clinic.  Patient with questions or concerns and was discharged in good condition after wound management by the surgical team.        Final Clinical Impression(s) / ED Diagnoses Final diagnoses:  Bleeding from wound     Clinical Impression: 1. Bleeding from wound     Disposition: Discharge  Condition: Good  I have discussed the results, Dx and Tx plan with the pt(& family if present). He/she/they expressed understanding and agree(s) with the plan. Discharge instructions discussed at great length. Strict return precautions discussed and pt &/or family have verbalized understanding of the instructions. No further questions at time  of discharge.    New Prescriptions   No medications on file    Follow Up: Serafina Mitchell, MD [address] [place] [address] [phone]          Dekisha Mesmer, Gwenyth Allegra, MD 02/20/22 [phone]

## 2022-02-20 NOTE — ED Notes (Signed)
No bleeding noted to surgical site. Pt neck dressed with gauze and tape.

## 2022-02-20 NOTE — ED Notes (Addendum)
Dressing on pt neck changed last night per pt. Dressing noted to be saturated upon arrival. Minimal bleeding noted to the site.

## 2022-02-20 NOTE — Discharge Instructions (Signed)
Your history and exam today were consistent with the wound opening back up and bleeding.  After the vascular surgery came down and manage it the bleeding appears to have stopped.  Please follow-up with them in clinic and please continue to monitor it.  Given your lack of other symptoms, we agreed to hold on imaging and labs and other work-up.  Please rest and stay hydrated and follow-up with them as directed.

## 2022-02-20 NOTE — ED Notes (Signed)
Patient verbalizes understanding of discharge instructions. Opportunity for questioning and answers were provided. Pt picked up by roommate. Oxygen transferred to patients home o2.

## 2022-02-20 NOTE — Telephone Encounter (Signed)
Pt's friend called while with pt, stating that she was having significant bleeding from her neck incision. When the RN changed the bandage on her neck, the pt noted that's when the bleeding started. It was enough to have to continue changing out bandages. She applied pressure to the incision for approx 20 min and it seemed better initially, but worsened. The pt slept through the night and when she woke and sat up, blood flowed down the front of her shirt. Pt instructed to apply pressure, but she felt that made it worse yesterday. Pt denies any dizziness, syncope, N/V, SOB, or stroke-like symptoms. Pt instructed to call EMS immediately to go to ED. Confirmed understanding.

## 2022-02-20 NOTE — ED Notes (Addendum)
Vascular provider at bedside for procedure. Site to be monitored for bleeding for an hour. If no bleeding pt can be d/c home.

## 2022-02-20 NOTE — ED Triage Notes (Signed)
Pt c/o bleeding through dressings approx every 2 hours over site from carotid surgery on left side of neck. Pt surgery on 02/18/22. Bleeding controlled upon arrival. Pt noted to have dry blood on check and gown.

## 2022-02-26 ENCOUNTER — Telehealth: Payer: Self-pay

## 2022-02-26 NOTE — Telephone Encounter (Signed)
Pt's friend, Manuela Schwartz, called asking when the pt's bandage can be removed. The tape was irritating her skin.  Reviewed pt's chart, returned call for clarification, two identifiers used. Informed her that it would be fine to remove it as long as the incision was fully closed and not draining. Given washing instructions. Confirmed understanding.

## 2022-03-03 NOTE — Discharge Summary (Signed)
[facility] Discharge Summary   Patient ID:  Wanda Harrington MRN: [id number] DOB/AGE: [birth year] 70 y.o.  Admit date: 02/18/2022 Discharge date: 02/19/22 Date of Surgery: 02/18/2022 Surgeon: Surgeon(s): Serafina Mitchell, MD  Admission Diagnosis: Carotid stenosis, asymptomatic [I65.29]  Discharge Diagnoses:  Carotid stenosis, asymptomatic [I65.29]  Secondary Diagnoses: Past Medical History:  Diagnosis Date   Anxiety    BP (high blood pressure)    Cancer (HCC)    Right breast, pt had R breast mastectomy including lymph node removal   Depression    Hepatitis    Hep C, pt states she has not had treatment   High cholesterol    Neuropathy    Stroke ([place]) 2021    Procedure(s): Left Transcarotid Artery Revascularization Using 61m x 40 mm Transcarotid Stent  Discharged Condition: stable  HPI:  71year old female with asymptomatic bilateral high-grade carotid stenosis.  She has previously undergone right-sided TCAR and comes in today for the left TCAR.   Hospital Course:  Wanda Witherowis a 71y.o. female is S/P  Procedure(s): Left Transcarotid Artery Revascularization Using 99m x 40 mm Transcarotid Stent Left neck incision with bloody drainage dry dressing replaced No neurologic deficits Requested to go home and was discharged with dressings and instructions to change as needed.  F/U in 2 weeks.      Significant Diagnostic Studies: CBC Lab Results  Component Value Date   WBC 6.7 02/19/2022   HGB 10.9 (L) 02/19/2022   HCT 34.1 (L) 02/19/2022   MCV 97.2 02/19/2022   PLT 157 02/19/2022    BMET    Component Value Date/Time   NA 134 (L) 02/19/2022 0408   NA 139 07/03/2021 1607   K 5.1 02/19/2022 0408   CL 104 02/19/2022 0408   CO2 21 (L) 02/19/2022 0408   GLUCOSE 135 (H) 02/19/2022 0408   BUN 24 (H) 02/19/2022 0408   BUN 22 07/03/2021 1607   CREATININE 1.67 (H) 02/19/2022 0408   CALCIUM 8.1 (L) 02/19/2022 0408   GFRNONAA 33 (L) 02/19/2022  0408   COAG Lab Results  Component Value Date   INR 1.1 02/12/2022   INR 1.0 11/17/2021     Disposition:  Discharge to :Home Discharge Instructions     Call MD for:  redness, tenderness, or signs of infection (pain, swelling, bleeding, redness, odor or green/yellow discharge around incision site)   Complete by: As directed    Call MD for:  severe or increased pain, loss or decreased feeling  in affected limb(s)   Complete by: As directed    Call MD for:  temperature >100.5   Complete by: As directed    Discharge instructions   Complete by: As directed    Change neck dressing as needed for bloody drainage.   Resume previous diet   Complete by: As directed       Allergies as of 02/19/2022       Reactions   Motrin [ibuprofen] Hives, Other (See Comments)   Pt says her feel "tingly headed" and causes hives   Other Nausea And Vomiting   Opioid pain meds    Aleve [naproxen] Rash   Latex Rash        Medication List     TAKE these medications    acetaminophen 325 MG tablet Commonly known as: TYLENOL Take 650 mg by mouth every 6 (six) hours as needed for moderate pain.   ALPRAZolam 0.25 MG tablet Commonly known as: XANAX Take 0.25 mg by mouth 2 (  two) times daily as needed for anxiety.   anastrozole 1 MG tablet Commonly known as: ARIMIDEX Take 1 tablet (1 mg total) by mouth daily.   aspirin EC 81 MG tablet Take 81 mg by mouth daily. Swallow whole.   atorvastatin 80 MG tablet Commonly known as: LIPITOR Take 1 tablet (80 mg total) by mouth daily.   cetirizine 10 MG tablet Commonly known as: ZYRTEC Take 10 mg by mouth daily as needed for allergies.   cholecalciferol 25 MCG (1000 UNIT) tablet Commonly known as: VITAMIN D3 Take 1,000 Units by mouth daily.   clopidogrel 75 MG tablet Commonly known as: PLAVIX Take 1 tablet (75 mg total) by mouth daily.   docusate sodium 100 MG capsule Commonly known as: COLACE Take 1 capsule (100 mg total) by mouth daily.    ezetimibe 10 MG tablet Commonly known as: ZETIA Take 1 tablet (10 mg total) by mouth daily.   fluticasone 50 MCG/ACT nasal spray Commonly known as: FLONASE Place 1 spray into both nostrils daily.   loratadine 10 MG tablet Commonly known as: CLARITIN Take 10 mg by mouth daily as needed for allergies.   ondansetron 4 MG tablet Commonly known as: Zofran Take 1 tablet (4 mg total) by mouth every 8 (eight) hours as needed for nausea or vomiting. What changed: Another medication with the same name was added. Make sure you understand how and when to take each.   ondansetron 4 MG tablet Commonly known as: Zofran Take 1 tablet (4 mg total) by mouth daily as needed for nausea or vomiting. What changed: You were already taking a medication with the same name, and this prescription was added. Make sure you understand how and when to take each.   oxyCODONE-acetaminophen 5-325 MG tablet Commonly known as: Percocet Take 1 tablet by mouth every 6 (six) hours as needed for severe pain. What changed: Another medication with the same name was added. Make sure you understand how and when to take each.   oxyCODONE-acetaminophen 5-325 MG tablet Commonly known as: PERCOCET/ROXICET Take 1 tablet by mouth every 6 (six) hours as needed for moderate pain. What changed: You were already taking a medication with the same name, and this prescription was added. Make sure you understand how and when to take each.   polyethylene glycol 17 g packet Commonly known as: MIRALAX / GLYCOLAX Take 17 g by mouth daily as needed for moderate constipation.   pregabalin 50 MG capsule Commonly known as: LYRICA Take 50 mg by mouth 2 (two) times daily.   sulfamethoxazole-trimethoprim 800-160 MG tablet Commonly known as: BACTRIM DS Take 1 tablet by mouth 2 (two) times daily.       Verbal and written Discharge instructions given to the patient. Wound care per Discharge AVS  Follow-up Information     Serafina Mitchell, MD Follow up in 2 week(s).   Specialties: Vascular Surgery, Cardiology Why: Office will call you to arrange your appt (sent) Contact information: [place] [address] [place] Follow up.   Why: home 02 arranged- contact info- [address] [place] [address], [phone]-  portable tank to be delivered to room prior to discharge.                Signed: Roxy Horseman 03/03/2022, 7:33 AM --- For VQI Registry use --- Instructions: Press F2 to tab through selections.  Delete question if not applicable.  Modified Rankin score at D/C (0-6): Rankin Score=0  IV medication needed for:  1. Hypertension: No 2. Hypotension: No  Post-op Complications: No  1. Post-op CVA or TIA: No  If yes: Event classification (right eye, left eye, right cortical, left cortical, verterobasilar, other):   If yes: Timing of event (intra-op, <6 hrs post-op, >=6 hrs post-op, unknown):   2. CN injury: No  If yes: CN  injuried   3. Myocardial infarction: No  If yes: Dx by (EKG or clinical, Troponin):   4.  CHF: No  5.  Dysrhythmia (new): No  6. Wound infection: No  7. Reperfusion symptoms: No  8. Return to OR: No  If yes: return to OR for (bleeding, neurologic, other CEA incision, other):   Discharge medications: Statin use:  Yes ASA use:  Yes Beta blocker use:  No  for medical reason not indicated ACE-Inhibitor use:  No  for medical reason not indicated P2Y12 Antagonist use: '[ ]'$  None, [ x] Plavix, '[ ]'$  Plasugrel, '[ ]'$  Ticlopinine, '[ ]'$  Ticagrelor, '[ ]'$  Other, '[ ]'$  No for medical reason, '[ ]'$  Non-compliant, '[ ]'$  Not-indicated Anti-coagulant use:  [ x] None, '[ ]'$  Warfarin, '[ ]'$  Rivaroxaban, '[ ]'$  Dabigatran, '[ ]'$  Other, '[ ]'$  No for medical reason, '[ ]'$  Non-compliant, '[ ]'$  Not-indicated

## 2022-03-12 ENCOUNTER — Other Ambulatory Visit: Payer: Self-pay | Admitting: *Deleted

## 2022-03-12 DIAGNOSIS — I6523 Occlusion and stenosis of bilateral carotid arteries: Secondary | ICD-10-CM

## 2022-03-23 ENCOUNTER — Ambulatory Visit (HOSPITAL_COMMUNITY): Payer: Medicare Other

## 2022-04-02 NOTE — Progress Notes (Signed)
POST OPERATIVE OFFICE NOTE    CC:  F/u for surgery  HPI:  This is a 71 y.o. female who is s/p left TCAR on 02/18/2022 by Dr. Trula Slade for asymptomatic carotid artery stenosis.  She previously underwent right TCAR on 11/21/2021 also by Dr. Trula Slade.   She was discharged home on POD 1 and returned to the ER on POD 2 with concerns for bleeding from her incision.  She did not have any obvious hematoma or airway compromise.  It was felt it was most likely a skin bleed and dermabond was removed and manual pressure applied for 15 minutes. She was ultimately discharged a couple of hours later.    She is on asa/plavix/statin.  Pt returns today for follow up.  Pt states that she had to return to the ER for some bloody drainage from her incision.  She was seen by Dr. Trula Slade in the ER.  This has resolved and has healed nicely.  She does have some left hand weakness that was present from a previous stroke.  She denies any trouble swallowing.  She is compliant with her asa/plavix/statin.  She states that the blood thinner makes her bruise easily.    Allergies  Allergen Reactions   Motrin [Ibuprofen] Hives and Other (See Comments)    Pt says her feel "tingly headed" and causes hives   Other Nausea And Vomiting    Opioid pain meds    Aleve [Naproxen] Rash   Latex Rash    Current Outpatient Medications  Medication Sig Dispense Refill   acetaminophen (TYLENOL) 325 MG tablet Take 650 mg by mouth every 6 (six) hours as needed for moderate pain.     ALPRAZolam (XANAX) 0.25 MG tablet Take 0.25 mg by mouth 2 (two) times daily as needed for anxiety.     anastrozole (ARIMIDEX) 1 MG tablet Take 1 tablet (1 mg total) by mouth daily. 30 tablet 3   aspirin EC 81 MG tablet Take 81 mg by mouth daily. Swallow whole.     atorvastatin (LIPITOR) 80 MG tablet Take 1 tablet (80 mg total) by mouth daily. 90 tablet 3   cetirizine (ZYRTEC) 10 MG tablet Take 10 mg by mouth daily as needed for allergies.     cholecalciferol  (VITAMIN D3) 25 MCG (1000 UNIT) tablet Take 1,000 Units by mouth daily.     clopidogrel (PLAVIX) 75 MG tablet Take 1 tablet (75 mg total) by mouth daily. 30 tablet 6   docusate sodium (COLACE) 100 MG capsule Take 1 capsule (100 mg total) by mouth daily. 20 capsule 0   ezetimibe (ZETIA) 10 MG tablet Take 1 tablet (10 mg total) by mouth daily. 90 tablet 4   fluticasone (FLONASE) 50 MCG/ACT nasal spray Place 1 spray into both nostrils daily.     loratadine (CLARITIN) 10 MG tablet Take 10 mg by mouth daily as needed for allergies.     ondansetron (ZOFRAN) 4 MG tablet Take 1 tablet (4 mg total) by mouth every 8 (eight) hours as needed for nausea or vomiting. (Patient not taking: Reported on 12/22/2021) 10 tablet 0   ondansetron (ZOFRAN) 4 MG tablet Take 1 tablet (4 mg total) by mouth daily as needed for nausea or vomiting. 30 tablet 1   oxyCODONE-acetaminophen (PERCOCET) 5-325 MG tablet Take 1 tablet by mouth every 6 (six) hours as needed for severe pain. (Patient not taking: Reported on 12/22/2021) 8 tablet 0   oxyCODONE-acetaminophen (PERCOCET/ROXICET) 5-325 MG tablet Take 1 tablet by mouth every 6 (six)  hours as needed for moderate pain. 20 tablet 0   polyethylene glycol (MIRALAX / GLYCOLAX) 17 g packet Take 17 g by mouth daily as needed for moderate constipation.     pregabalin (LYRICA) 50 MG capsule Take 50 mg by mouth 2 (two) times daily.     sulfamethoxazole-trimethoprim (BACTRIM DS) 800-160 MG tablet Take 1 tablet by mouth 2 (two) times daily. 6 tablet 0   No current facility-administered medications for this visit.     ROS:  See HPI  Physical Exam:  Today's Vitals   04/06/22 1552  BP: (!) 148/72  Pulse: 75  Resp: 20  Temp: 98 F (36.7 C)  TempSrc: Temporal  SpO2: 96%  Weight: 98 lb 1.6 oz (44.5 kg)  Height: '5\' 1"'$  (1.549 m)   Body mass index is 18.54 kg/m.   Incision:  healing nicely Neuro:  left hand weakness otherwise in tact     Carotid duplex 04/06/2022: Bilateral  carotid stents are patent without stenosis   Assessment/Plan:  This is a 71 y.o. female who is s/p:  left TCAR on 02/18/2022 by Dr. Trula Slade for asymptomatic carotid artery stenosis.  She previously underwent right TCAR on 11/21/2021 also by Dr. Trula Slade.   -pt doing well since surgery.  Duplex reveals that stents bilaterally are patent.  -she has cut back on her smoking.  Encouraged her to continue to cut back.  -continue asa/statin/plavix -f/u in 9 months with carotid duplex.  She will call sooner if she has any issues before then. -she knows to call 911 should she develop any neurologic sx and these were reviewed with her.    Leontine Locket, [facility] [phone]   Clinic MD:  Trula Slade

## 2022-04-06 ENCOUNTER — Encounter: Payer: Self-pay | Admitting: Physician Assistant

## 2022-04-06 ENCOUNTER — Other Ambulatory Visit: Payer: Self-pay

## 2022-04-06 ENCOUNTER — Ambulatory Visit (INDEPENDENT_AMBULATORY_CARE_PROVIDER_SITE_OTHER): Payer: Medicare Other | Admitting: Physician Assistant

## 2022-04-06 ENCOUNTER — Ambulatory Visit (HOSPITAL_COMMUNITY)
Admission: RE | Admit: 2022-04-06 | Discharge: 2022-04-06 | Disposition: A | Discharge status: Home / Self Care | Payer: Medicare Other | Source: Ambulatory Visit | Attending: Physician Assistant | Admitting: Physician Assistant

## 2022-04-06 VITALS — BP 148/72 | HR 75 | Temp 98.0°F | Resp 20 | Ht 61.0 in | Wt 98.1 lb

## 2022-04-06 DIAGNOSIS — I6523 Occlusion and stenosis of bilateral carotid arteries: Secondary | ICD-10-CM | POA: Diagnosis present

## 2022-05-17 ENCOUNTER — Other Ambulatory Visit: Payer: Self-pay | Admitting: Surgery

## 2022-07-22 ENCOUNTER — Telehealth: Payer: Self-pay | Admitting: *Deleted

## 2022-07-22 NOTE — Telephone Encounter (Signed)
Pt daughter called stating breast center needed imaging from old records to be present before performing mammograms. Advised pt daughter to come in and sign medical release forms so that we can, hopefully retrieve medical records from hospital in [place] where she received care. [facility]) Pt daughter verbalized understanding

## 2022-07-23 ENCOUNTER — Inpatient Hospital Stay: Payer: 59 | Admitting: Hematology and Oncology

## 2022-08-27 ENCOUNTER — Inpatient Hospital Stay: Payer: 59 | Attending: Hematology and Oncology | Admitting: Hematology and Oncology

## 2022-08-27 NOTE — Progress Notes (Deleted)
[facility] NOTE  Patient Care Team: Emelia Loron, NP as PCP - General (Nurse Practitioner) Iran Planas, MD as Consulting Physician (Orthopedic Surgery)  CHIEF COMPLAINTS/PURPOSE OF CONSULTATION:  Newly diagnosed breast cancer  HISTORY OF PRESENTING ILLNESS:  Wanda Harrington 72 y.o. 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, estrogen receptor positive ([place])  10/08/2014 Surgery   T2 N1 M0 right breast IDC, stage IIb ER diagnosis, grade 2, ER 96%, PR 10%, KI of 20% negative for HER2 by IHC and FISH diagnosed in April 05, 2014 treated with neoadjuvant chemotherapy with dose dense Adriamycin and Cytoxan followed by weekly Taxol 05/03/2014 to August 30, 2014, received 9 weeks of Taxol because of neuropathy, status post right modified radical mastectomy on October 08, 2014 which revealed a 2.3 cm residual tumor with 3 out of 11 positive lymph nodes, adjuvant postmastectomy radiation completed on December 28, 2014 and adjuvant Arimidex prescribed on December 27, 2014.   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  Patient denies any noncompliance to anastrozole.  Since her last visit, she had some vascular intervention for her carotid artery stenosis and is anticipating more procedures.  She is still  unable to schedule her mammogram.  She denies any new breast masses.  No changes in her breathing or new bone pains.  No change in bowel or urinary habits.  Rest of the pertinent review of systems reviewed and negative  MEDICAL HISTORY:  Past Medical History:  Diagnosis Date   Anxiety    BP (high blood pressure)    Cancer (HCC)    Right breast, pt had R breast mastectomy including lymph node removal   Depression    Hepatitis    Hep C, pt states she has not had treatment   High cholesterol    Neuropathy    Stroke ([place]) 2021    SURGICAL HISTORY: Past Surgical History:  Procedure Laterality Date   breast cancer Right 2016   EXTRACORPOREAL SHOCK WAVE LITHOTRIPSY Right 11/21/2021   Procedure: INTRAVASCULAR SHOCK WAVE LITHOTRIPSY RIGHT CAROTID ARTERY;  Surgeon: Serafina Mitchell, MD;  Location: [facility];  Service: Vascular;  Laterality: Right;   MASTECTOMY Right 2016   including lymph node removal   PORTACATH PLACEMENT  2016   TRANSCAROTID ARTERY REVASCULARIZATION  Right 11/21/2021   Procedure: Right Transcarotid Artery Revascularization;  Surgeon: Serafina Mitchell, MD;  Location: [facility];  Service: Vascular;  Laterality: Right;   TRANSCAROTID ARTERY REVASCULARIZATION  Left 02/18/2022   Procedure: Left Transcarotid Artery Revascularization Using 68m x 40 mm Transcarotid Stent;  Surgeon: BSerafina Mitchell MD;  Location: [facility];  Service: Vascular;  Laterality: Left;   ULTRASOUND GUIDANCE FOR VASCULAR ACCESS Left 11/21/2021   Procedure: ULTRASOUND GUIDANCE FOR VASCULAR ACCESS;  Surgeon: BSerafina Mitchell MD;  [place] [facility]  Service: Vascular;  Laterality: Left;    SOCIAL HISTORY: Social History   Socioeconomic History   Marital status: Widowed    Spouse name: Not on file   Number of children: 0   Years of education: Not on file   Highest education level: Not on file  Occupational History   Not on file  Tobacco Use   Smoking status: Every Day    Packs/day: 0.50    Types: Cigarettes    Smokeless tobacco: Never  Vaping Use   Vaping Use: Never used  Substance and Sexual Activity   Alcohol use: Yes    Comment: rarely   Drug use: Yes    Types: Marijuana    Comment: every day   Sexual activity: Not Currently  Other Topics Concern   Not on file  Social History Narrative   Not on file   Social Determinants of Health   Financial Resource Strain: Not on file  Food Insecurity: Not on file  Transportation Needs: Not on file  Physical Activity: Not on file  Stress: Not on file  Social Connections: Not on file  Intimate Partner Violence: Not on file    FAMILY HISTORY: Family History  Adopted: Yes  Family history unknown: Yes    ALLERGIES:  is allergic to motrin [ibuprofen], other, aleve [naproxen], and latex.  MEDICATIONS:  Current Outpatient Medications  Medication Sig Dispense Refill   acetaminophen (TYLENOL) 325 MG tablet Take 650 mg by mouth every 6 (six) hours as needed for moderate pain.     ALPRAZolam (XANAX) 0.25 MG tablet Take 0.25 mg by mouth 2 (two) times daily as needed for anxiety.     anastrozole (ARIMIDEX) 1 MG tablet Take 1 tablet (1 mg total) by mouth daily. 30 tablet 3   aspirin EC 81 MG tablet Take 81 mg by mouth daily. Swallow whole.     atorvastatin (LIPITOR) 80 MG tablet Take 1 tablet (80 mg total) by mouth daily. 90 tablet 3   cetirizine (ZYRTEC) 10 MG tablet Take 10 mg by mouth daily as needed for allergies.     cholecalciferol (VITAMIN D3) 25 MCG (1000 UNIT) tablet Take 1,000 Units by mouth daily.     clopidogrel (PLAVIX) 75 MG tablet TAKE 1 TABLET(75 MG) BY MOUTH DAILY 30 tablet 6   clopidogrel (PLAVIX) 75 MG tablet TAKE 1 TABLET(75 MG) BY MOUTH DAILY 30 tablet 6   docusate sodium (COLACE) 100 MG capsule Take 1 capsule (100 mg total) by mouth daily. 20 capsule 0   ezetimibe (ZETIA) 10 MG tablet Take 1 tablet (10 mg total) by mouth daily. 90 tablet 4   fluticasone (FLONASE) 50 MCG/ACT nasal spray Place 1 spray into both nostrils daily.      Fluticasone-Umeclidin-Vilant (TRELEGY ELLIPTA) 100-62.5-25 MCG/ACT AEPB Inhale into the lungs.     lisinopril-hydrochlorothiazide (ZESTORETIC) 10-12.5 MG tablet Take 1 tablet by mouth daily.     loratadine (CLARITIN) 10 MG tablet Take 10 mg by mouth daily as needed for allergies.     ondansetron (ZOFRAN) 4 MG tablet Take 1 tablet (4 mg total) by mouth every 8 (eight) hours as needed for nausea or vomiting. (Patient not taking: Reported on 12/22/2021) 10 tablet 0   ondansetron (ZOFRAN) 4 MG tablet Take 1 tablet (4 mg total) by mouth daily as needed for nausea or vomiting. 30 tablet 1   oxyCODONE-acetaminophen (PERCOCET) 5-325 MG tablet Take 1 tablet by mouth every 6 (six) hours as needed for severe pain. 8 tablet 0   oxyCODONE-acetaminophen (PERCOCET/ROXICET) 5-325  MG tablet Take 1 tablet by mouth every 6 (six) hours as needed for moderate pain. 20 tablet 0   polyethylene glycol (MIRALAX / GLYCOLAX) 17 g packet Take 17 g by mouth daily as needed for moderate constipation.     pregabalin (LYRICA) 50 MG capsule Take 50 mg by mouth 2 (two) times daily.     sulfamethoxazole-trimethoprim (BACTRIM DS) 800-160 MG tablet Take 1 tablet by mouth 2 (two) times daily. 6 tablet 0   No current facility-administered medications for this visit.    REVIEW OF SYSTEMS:   Constitutional: Denies fevers, chills or abnormal night sweats Eyes: Denies blurriness of vision, double vision or watery eyes Ears, nose, mouth, throat, and face: Denies mucositis or sore throat Respiratory: Denies cough, dyspnea or wheezes Cardiovascular: Denies palpitation, chest discomfort or lower extremity swelling Gastrointestinal:  Denies nausea, heartburn or change in bowel habits Skin: Denies abnormal skin rashes Lymphatics: Denies new lymphadenopathy or easy bruising Neurological:Denies numbness, tingling or new weaknesses Behavioral/Psych: Mood is stable, no new changes  Breast: Denies any palpable lumps or discharge All other  systems were reviewed with the patient and are negative.  PHYSICAL EXAMINATION: ECOG PERFORMANCE STATUS: 0 - Asymptomatic  There were no vitals filed for this visit.   There were no vitals filed for this visit.  Physical Exam Constitutional:      Appearance: Normal appearance.     Comments: Appears older than stated age.   Cardiovascular:     Rate and Rhythm: Normal rate and regular rhythm.  Pulmonary:     Effort: Pulmonary effort is normal.     Breath sounds: Normal breath sounds.  Chest:     Comments: Status post right mastectomy.  Left breast normal to inspection and palpation.  No palpable regional adenopathy Musculoskeletal:        General: Swelling (Bilateral chronic venous stasis changes) present. Normal range of motion.     Cervical back: Normal range of motion and neck supple. No rigidity.  Lymphadenopathy:     Cervical: No cervical adenopathy.  Neurological:     Mental Status: She is alert.     LABORATORY DATA:  I have reviewed the data as listed Lab Results  Component Value Date   WBC 6.7 02/19/2022   HGB 10.9 (L) 02/19/2022   HCT 34.1 (L) 02/19/2022   MCV 97.2 02/19/2022   PLT 157 02/19/2022   Lab Results  Component Value Date   NA 134 (L) 02/19/2022   K 5.1 02/19/2022   CL 104 02/19/2022   CO2 21 (L) 02/19/2022    RADIOGRAPHIC STUDIES: I have personally reviewed the radiological reports and agreed with the findings in the report.  ASSESSMENT AND PLAN:  No problem-specific Assessment & Plan notes found for this encounter.    All questions were answered. The patient knows to call the clinic with any problems, questions or concerns. I spent 60 minutes in the care of this patient reviewing her history, previous medical records, counseling, coordination of care.    Benay Pike, MD 08/27/22

## 2022-12-08 ENCOUNTER — Other Ambulatory Visit (HOSPITAL_BASED_OUTPATIENT_CLINIC_OR_DEPARTMENT_OTHER): Payer: Self-pay

## 2022-12-16 IMAGING — DX DG CHEST 1V
1 series · 1 of 1 positions shown · non-contrast
Comparison: None.

CLINICAL DATA: Porta catheter placed elsewhere.  Smoker.

EXAM:
CHEST  1 VIEW

[chest pa]
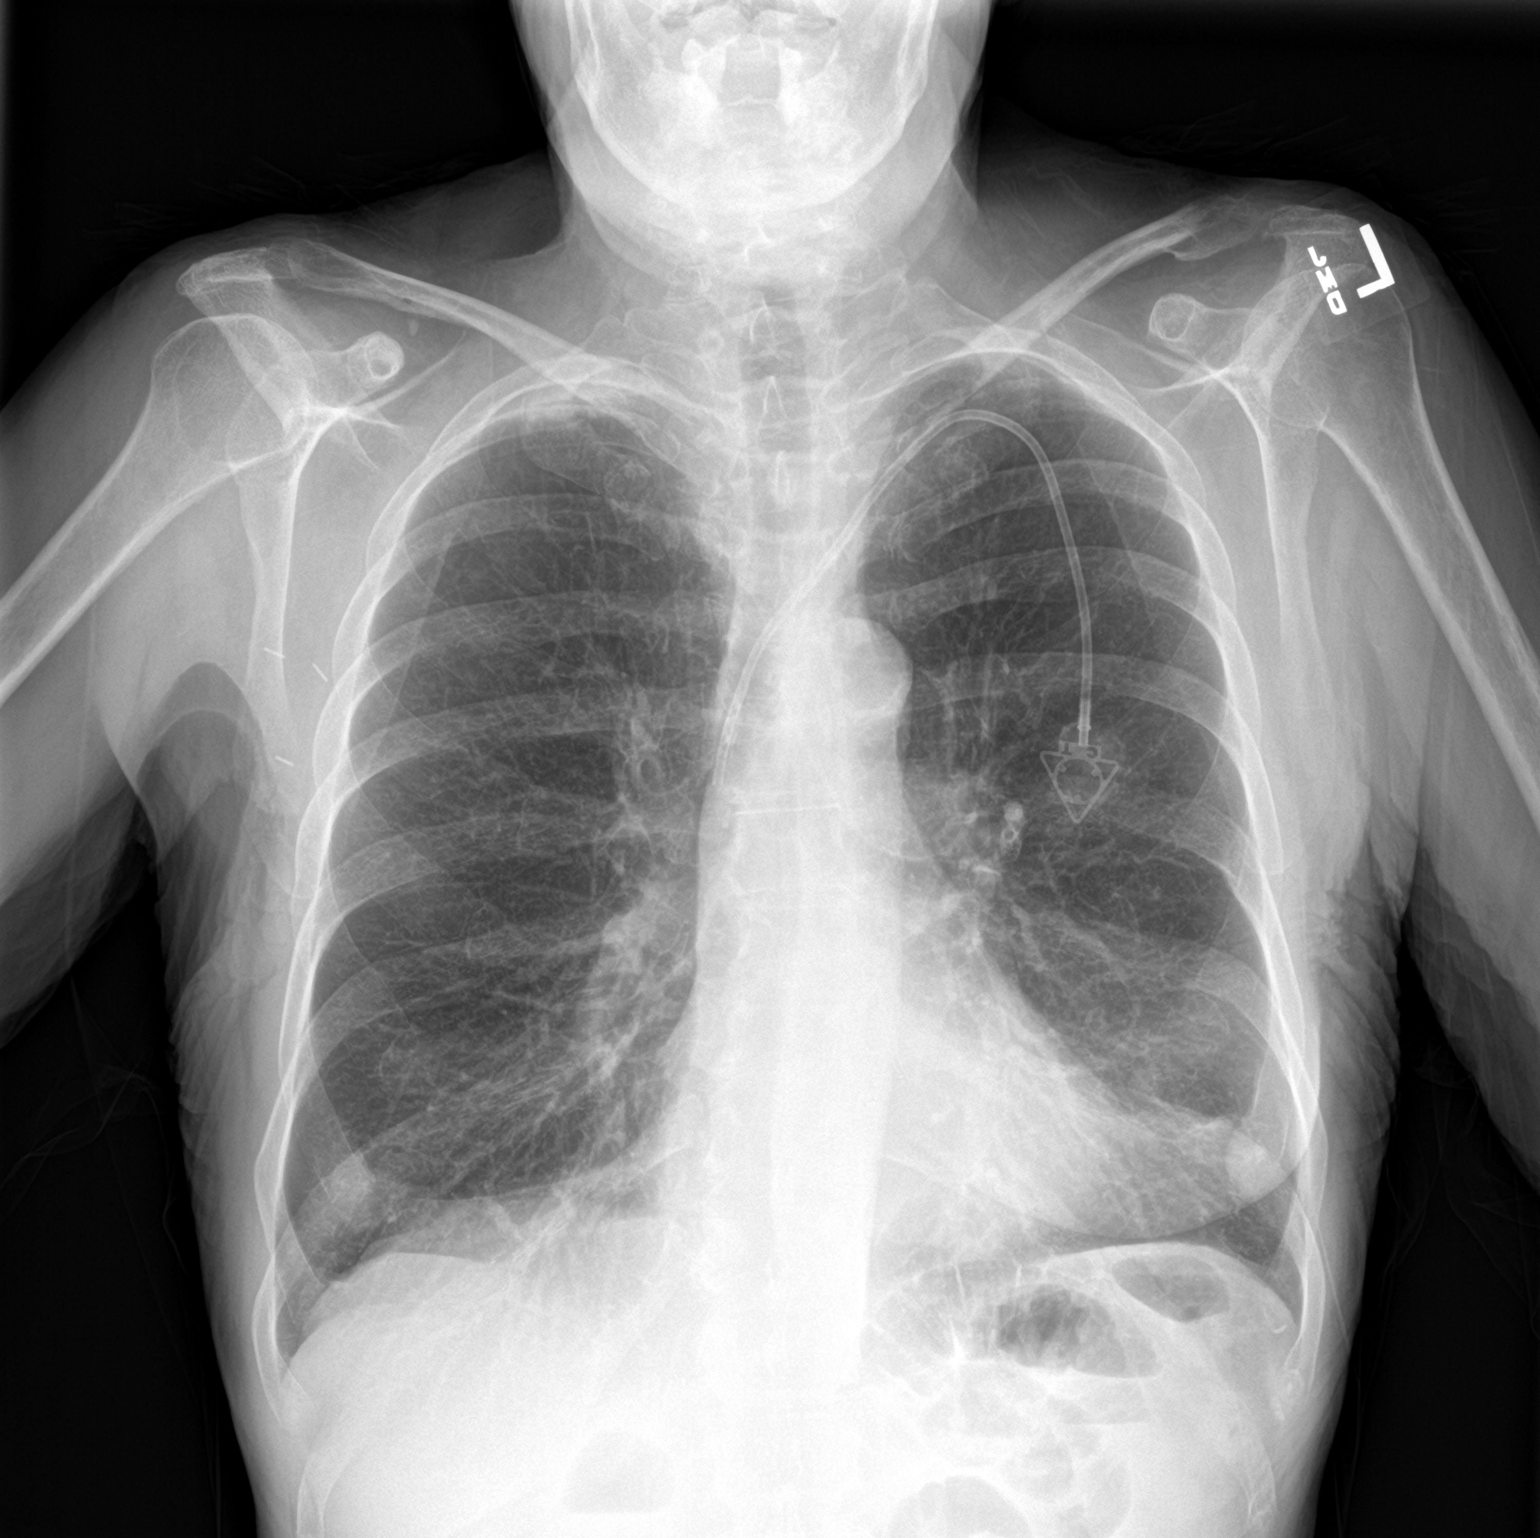

[1 of 1 positions shown; findings below may reference images not displayed]

FINDINGS: Normal sized heart. Left subclavian porta catheter with its tip in
the superior vena cava. No pneumothorax. Probable nipple shadows and
overlapping ribs at both lateral lung bases. The lungs are mildly
hyperexpanded with mild diffuse peribronchial thickening. Right
axillary surgical clips. Unremarkable bones.
IMPRESSION: 1. Left subclavian porta catheter tip in the superior vena cava.
2. Mild changes of COPD and chronic bronchitis.

## 2023-03-21 IMAGING — CT CT ANGIO NECK
2 of 7 series · 8 of 33 positions shown · IV contrast (APPLIED)
Comparison: Brain and cervical spine MRI 08/06/2021.

CLINICAL DATA: 70-year-old female with upper extremity numbness and
weakness. Abnormal right internal carotid artery, right vertebral
artery.



[Series 5: cta neck/head · axial · 0.48mm/px · z∈[-346,-276]mm · 2 of 107 slices shown]
[im 36/107  soft-tissue]
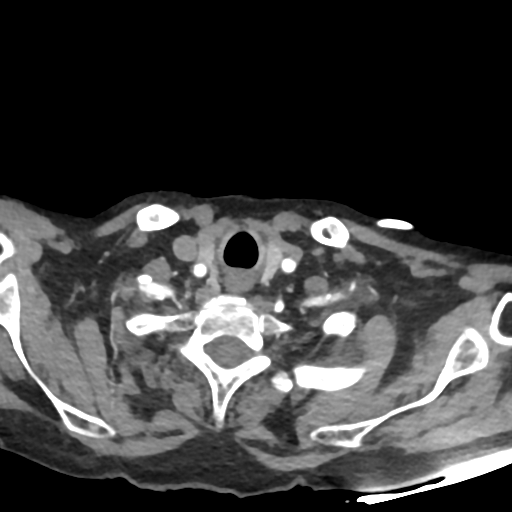
[im 71/107  soft-tissue]
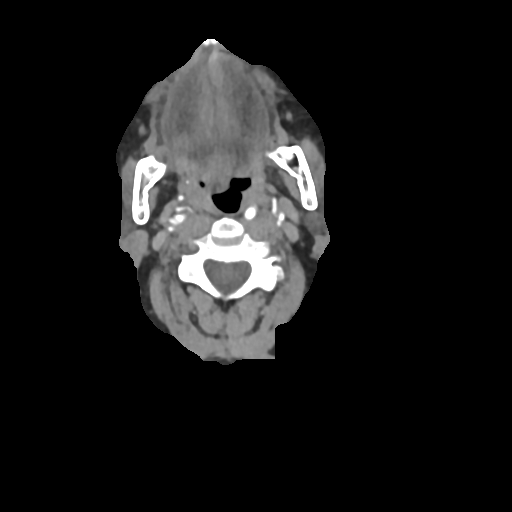

[Series 7: ax thins · axial · 0.39mm/px · z∈[-386,-234]mm · 6 of 214 slices shown]
[im 31/214  soft-tissue]
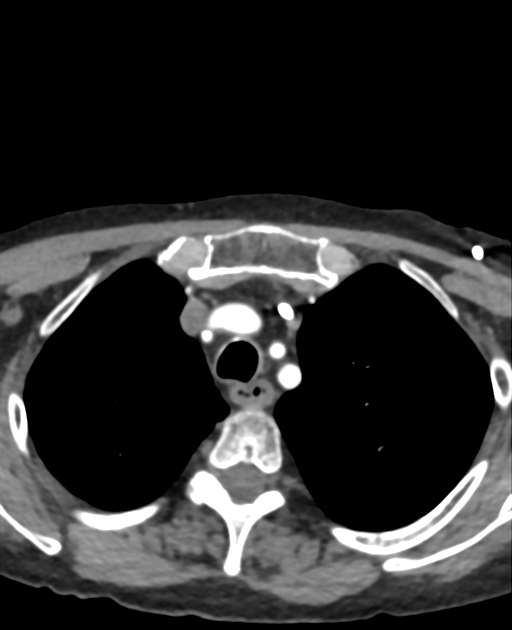
[im 61/214  bone]
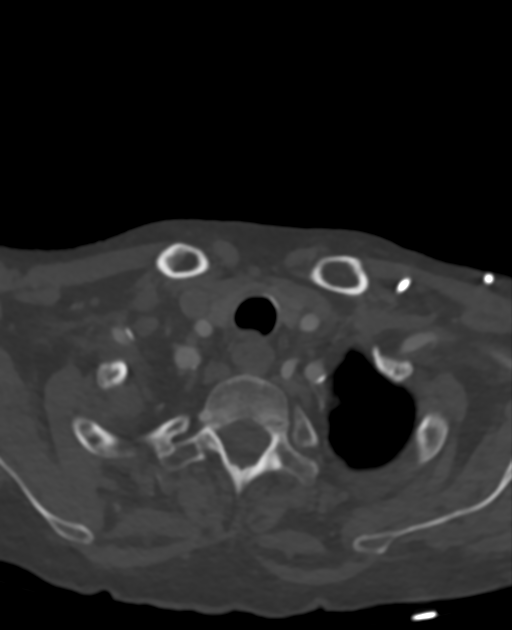
[im 92/214  soft-tissue]
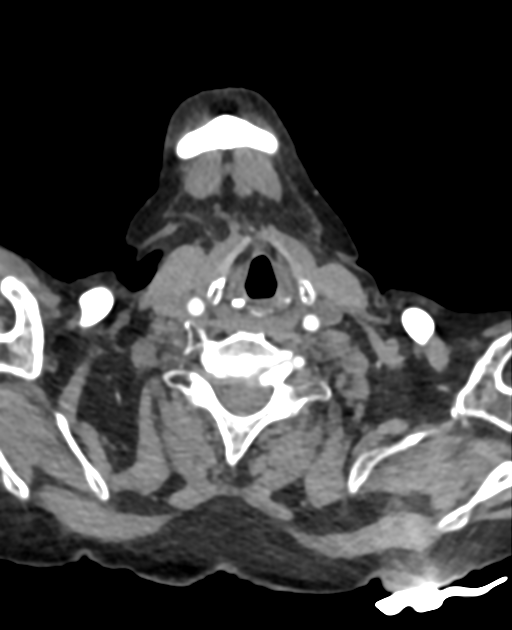
[im 122/214  bone]
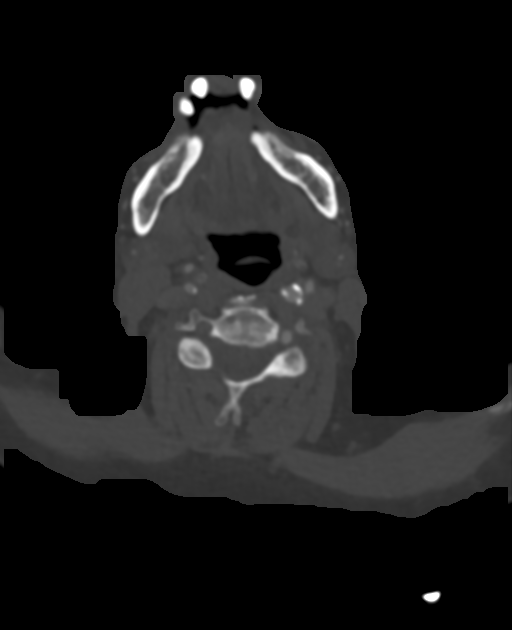
[im 153/214  soft-tissue]
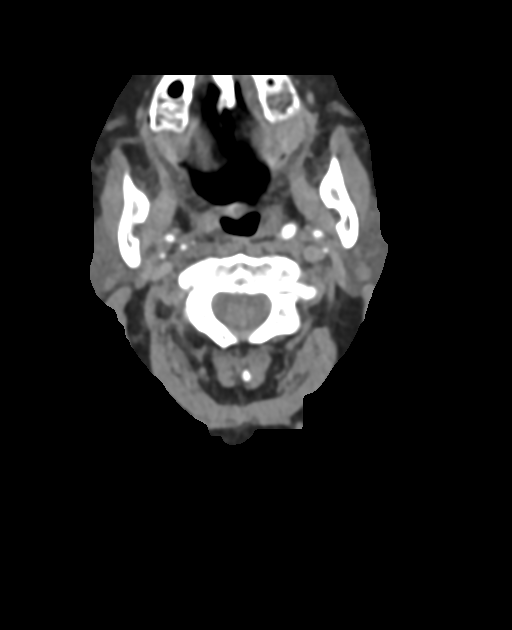
[im 183/214  bone]
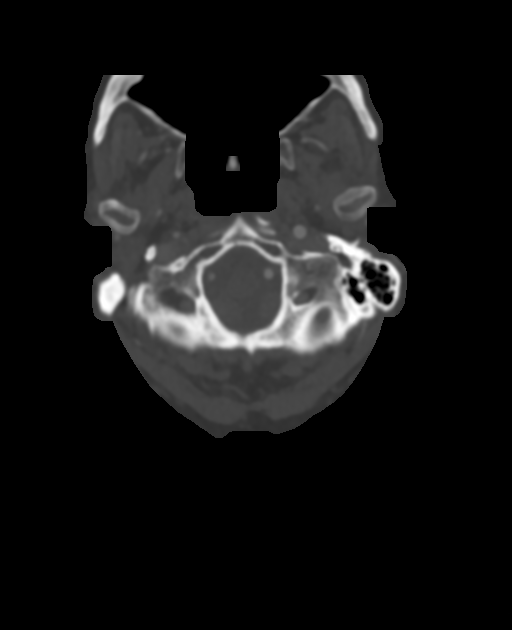

[8 of 33 positions shown; findings below may reference images not displayed]

RADIATION DOSE REDUCTION: This exam was performed according to the
departmental dose-optimization program which includes automated
exposure control, adjustment of the mA and/or kV according to
patient size and/or use of iterative reconstruction technique.

CONTRAST:  50mL OMNIPAQUE IOHEXOL 350 MG/ML SOLN
FINDINGS: Skeleton: Mostly absent dentition. Cervical spine degeneration
appears generally mild for age. No acute osseous abnormality
identified.

Upper chest: Centrilobular emphysema. Mild apical lung scarring.
Left chest subclavian approach Port-A-Cath. No superior mediastinal
lymphadenopathy.

Other neck: Mild motion artifact at the soft palate and base of
tongue. No neck mass or lymphadenopathy identified.

Grossly negative visible brain parenchyma.

Aortic arch: Calcified aortic atherosclerosis. 3 vessel arch
configuration.

Right carotid system: Brachiocephalic artery soft and calcified
plaque without stenosis. Mild plaque at the right CCA origin without
stenosis. Bulky calcified plaque at the right carotid bifurcation
continuing into the right ICA origin and bulb with radiographic
string sign stenosis of that vessel on series 7, image 102.
High-grade stenosis there is along a segment of about 11 mm to
series 7, image 98. Downstream right ICA is enhancing although only
thread-like to the skull base (series 7, image 38). The visible
right ICA siphon likewise appears thread-like until improved caliber
in the cavernous segment is visible on series 7, image 7. Some
superimposed siphon calcified plaque.

Left carotid system: Soft and calcified plaque from the left CCA
intermittently to the carotid bifurcation without CCA stenosis.
Bulky calcified plaque at the left ICA origin and bulb with
high-grade stenosis on series 7, image 93, radiographic string sign
on series 9, image 125 along a segment of about 3-4 mm. Left ICA
remains patent and the left ICA downstream caliber is relatively
normal. Visible left ICA siphon is patent with some calcified plaque
visible.

Vertebral arteries:
Proximal right subclavian artery soft and calcified plaque with less
than 50% stenosis. Occluded right vertebral artery origin were
calcified plaque is noted on series 8, image 107. Only faint
reconstitution of the distal right vertebral artery in the V3
segment (series 7, image 48). And the right V4 segment is diminutive
but patent to the vertebrobasilar junction. Right PICA origin
remains patent on series 7, image 18.

Visible basilar artery is patent.

Mild proximal left subclavian artery soft and calcified plaque
without stenosis. Mild calcified plaque at the left vertebral artery
origin without significant stenosis on series 8, image 115. Dominant
appearing left vertebral artery is patent throughout the neck and to
the vertebrobasilar junction with tortuosity but no stenosis.
Diminutive left PICA is patent.

Review of the MIP images confirms the above findings
IMPRESSION: 1. Positive for High-grade Right > Left ICA origin/bulb stenoses:
- RADIOGRAPHIC STRING SIGN along an 11 mm segment of the Right with
patent but only thread-like downstream Right ICA until better
reconstitution in the visible cavernous segment.
- High-grade Left ICA bulb stenosis approaching a radiographic
string sign, but with normal downstream ICA caliber.

2. Occluded Right Vertebral Artery origin with diminutive
reconstituted V3 and V4 segments. Right PICA remains patent.

3. Dominant appearing Left Vertebral Artery without stenosis.

4. Aortic Atherosclerosis (HYZ3E-8QC.C) and Emphysema (HYZ3E-BBB.4).

## 2023-03-23 ENCOUNTER — Other Ambulatory Visit: Payer: Self-pay | Admitting: Vascular Surgery

## 2023-06-01 ENCOUNTER — Other Ambulatory Visit: Payer: Self-pay | Admitting: *Deleted

## 2023-06-01 DIAGNOSIS — I6523 Occlusion and stenosis of bilateral carotid arteries: Secondary | ICD-10-CM

## 2023-06-14 ENCOUNTER — Ambulatory Visit (INDEPENDENT_AMBULATORY_CARE_PROVIDER_SITE_OTHER): Payer: 59 | Admitting: Physician Assistant

## 2023-06-14 ENCOUNTER — Ambulatory Visit (HOSPITAL_COMMUNITY)
Admission: RE | Admit: 2023-06-14 | Discharge: 2023-06-14 | Disposition: A | Discharge status: Home / Self Care | Payer: 59 | Source: Ambulatory Visit | Attending: Surgery | Admitting: Surgery

## 2023-06-14 VITALS — BP 99/65 | HR 93 | Temp 98.8°F | Resp 22 | Ht 61.0 in | Wt 105.6 lb

## 2023-06-14 DIAGNOSIS — I6523 Occlusion and stenosis of bilateral carotid arteries: Secondary | ICD-10-CM

## 2023-06-14 NOTE — Progress Notes (Signed)
Office Note   History of Present Illness   Wanda Harrington is a 72 y.o. (09-Dec-1950) female who presents for surveillance of carotid artery stenosis. She has a history of right TCAR on 11/21/2021 and left TCAR on 02/18/2022 by Dr.Brabham. She does have some residual left hand weakness from a prior CVA.  The patient returns today for follow up. She denies any recent strokelike symptoms such as slurred speech, facial droop, sudden visual changes, or sudden weakness/numbness. She also denies any recent CVA/TIA diagnosis.   She takes a daily aspirin, plavix, and statin. She has been seeing a pulmonologist for her COPD. She has decreased her cigarettes from 2PPD to less than a pack a day. Current Outpatient Medications  Medication Sig Dispense Refill   acetaminophen (TYLENOL) 325 MG tablet Take 650 mg by mouth every 6 (six) hours as needed for moderate pain.     ALPRAZolam (XANAX) 0.25 MG tablet Take 0.25 mg by mouth 2 (two) times daily as needed for anxiety.     anastrozole (ARIMIDEX) 1 MG tablet Take 1 tablet (1 mg total) by mouth daily. 30 tablet 3   aspirin EC 81 MG tablet Take 81 mg by mouth daily. Swallow whole.     atorvastatin (LIPITOR) 80 MG tablet Take 1 tablet (80 mg total) by mouth daily. 90 tablet 3   cetirizine (ZYRTEC) 10 MG tablet Take 10 mg by mouth daily as needed for allergies.     cholecalciferol (VITAMIN D3) 25 MCG (1000 UNIT) tablet Take 1,000 Units by mouth daily.     clopidogrel (PLAVIX) 75 MG tablet TAKE 1 TABLET(75 MG) BY MOUTH DAILY 30 tablet 6   clopidogrel (PLAVIX) 75 MG tablet TAKE 1 TABLET(75 MG) BY MOUTH DAILY 30 tablet 6   docusate sodium (COLACE) 100 MG capsule Take 1 capsule (100 mg total) by mouth daily. 20 capsule 0   ezetimibe (ZETIA) 10 MG tablet Take 1 tablet (10 mg total) by mouth daily. 90 tablet 4   fluticasone (FLONASE) 50 MCG/ACT nasal spray Place 1 spray into both nostrils daily.     Fluticasone-Umeclidin-Vilant (TRELEGY ELLIPTA) 100-62.5-25 MCG/ACT  AEPB Inhale into the lungs.     lisinopril-hydrochlorothiazide (ZESTORETIC) 10-12.5 MG tablet Take 1 tablet by mouth daily.     loratadine (CLARITIN) 10 MG tablet Take 10 mg by mouth daily as needed for allergies.     ondansetron (ZOFRAN) 4 MG tablet Take 1 tablet (4 mg total) by mouth every 8 (eight) hours as needed for nausea or vomiting. (Patient not taking: Reported on 12/22/2021) 10 tablet 0   ondansetron (ZOFRAN) 4 MG tablet Take 1 tablet (4 mg total) by mouth daily as needed for nausea or vomiting. 30 tablet 1   oxyCODONE-acetaminophen (PERCOCET) 5-325 MG tablet Take 1 tablet by mouth every 6 (six) hours as needed for severe pain. 8 tablet 0   oxyCODONE-acetaminophen (PERCOCET/ROXICET) 5-325 MG tablet Take 1 tablet by mouth every 6 (six) hours as needed for moderate pain. 20 tablet 0   polyethylene glycol (MIRALAX / GLYCOLAX) 17 g packet Take 17 g by mouth daily as needed for moderate constipation.     pregabalin (LYRICA) 50 MG capsule Take 50 mg by mouth 2 (two) times daily.     sulfamethoxazole-trimethoprim (BACTRIM DS) 800-160 MG tablet Take 1 tablet by mouth 2 (two) times daily. 6 tablet 0   No current facility-administered medications for this visit.    REVIEW OF SYSTEMS (negative unless checked):   Cardiac:  []  Chest pain or  chest pressure? [x]  Shortness of breath upon activity? []  Shortness of breath when lying flat? []  Irregular heart rhythm?  Vascular:  []  Pain in calf, thigh, or hip brought on by walking? []  Pain in feet at night that wakes you up from your sleep? []  Blood clot in your veins? []  Leg swelling?  Pulmonary:  [x]  Oxygen at home? []  Productive cough? [x]  Wheezing?  Neurologic:  []  Sudden weakness in arms or legs? []  Sudden numbness in arms or legs? []  Sudden onset of difficult speaking or slurred speech? []  Temporary loss of vision in one eye? []  Problems with dizziness?  Gastrointestinal:  []  Blood in stool? []  Vomited blood?  Genitourinary:   []  Burning when urinating? []  Blood in urine?  Psychiatric:  []  Major depression  Hematologic:  []  Bleeding problems? []  Problems with blood clotting?  Dermatologic:  []  Rashes or ulcers?  Constitutional:  []  Fever or chills?  Ear/Nose/Throat:  []  Change in hearing? []  Nose bleeds? []  Sore throat?  Musculoskeletal:  []  Back pain? []  Joint pain? []  Muscle pain?   Physical Examination   Vitals:   06/14/23 1454  BP: 99/65  Pulse: 93  Resp: (!) 22  Temp: 98.8 F (37.1 C)  TempSrc: Temporal  SpO2: 91%  Weight: 105 lb 9.6 oz (47.9 kg)  Height: 5\' 1"  (1.549 m)   Body mass index is 19.95 kg/m.  General:  WDWN in NAD; vital signs documented above Gait: Not observed HENT: WNL, normocephalic Pulmonary: normal non-labored breathing , without rales, rhonchi,  wheezing Cardiac: regular Abdomen: soft, NT, no masses Skin: without rashes Extremities: without ischemic changes, without gangrene , without cellulitis; without open wounds;  Musculoskeletal: no muscle wasting or atrophy  Neurologic: A&O X 3;  No focal weakness or paresthesias are detected Psychiatric:  The pt has Normal affect.  Non-Invasive Vascular Imaging   Bilateral Carotid Duplex (06/14/2023):  R ICA stenosis:  patent right carotid stent R VA:  patent and antegrade L ICA stenosis:   patent left carotid stent L VA:  patent and antegrade   Medical Decision Making   Wanda Harrington is a 71 y.o. female who presents for surveillance of carotid artery stenosis  Based on the patient's vascular studies, her carotid stents are patent bilaterally without stenosis She denies any strokelike symptoms such as slurred speech, facial droop, sudden visual changes, or sudden weakness/numbness.  She does have left hand weakness due to prior CVA. She has no other neurologic deficits She can follow up with our office in 1 year with repeat carotid duplex   Loel Dubonnet PA-C [facility] Office: [phone]  Call MD: Karin Lieu

## 2023-06-17 ENCOUNTER — Other Ambulatory Visit: Payer: Self-pay

## 2023-06-17 DIAGNOSIS — I6523 Occlusion and stenosis of bilateral carotid arteries: Secondary | ICD-10-CM

## 2023-08-21 ENCOUNTER — Other Ambulatory Visit: Payer: Self-pay | Admitting: Physician Assistant

## 2024-07-12 ENCOUNTER — Other Ambulatory Visit: Payer: Self-pay

## 2024-07-12 ENCOUNTER — Emergency Department (HOSPITAL_COMMUNITY)

## 2024-07-12 ENCOUNTER — Encounter (HOSPITAL_COMMUNITY): Payer: Self-pay

## 2024-07-12 ENCOUNTER — Emergency Department (HOSPITAL_COMMUNITY)
Admission: EM | Admit: 2024-07-12 | Discharge: 2024-07-13 | Disposition: A | Discharge status: Home / Self Care | Attending: Emergency Medicine | Admitting: Emergency Medicine

## 2024-07-12 DIAGNOSIS — Z8673 Personal history of transient ischemic attack (TIA), and cerebral infarction without residual deficits: Secondary | ICD-10-CM | POA: Diagnosis not present

## 2024-07-12 DIAGNOSIS — J449 Chronic obstructive pulmonary disease, unspecified: Secondary | ICD-10-CM | POA: Diagnosis not present

## 2024-07-12 DIAGNOSIS — R350 Frequency of micturition: Secondary | ICD-10-CM | POA: Insufficient documentation

## 2024-07-12 DIAGNOSIS — Z91040 Latex allergy status: Secondary | ICD-10-CM | POA: Insufficient documentation

## 2024-07-12 DIAGNOSIS — R5383 Other fatigue: Secondary | ICD-10-CM | POA: Insufficient documentation

## 2024-07-12 DIAGNOSIS — I10 Essential (primary) hypertension: Secondary | ICD-10-CM | POA: Diagnosis not present

## 2024-07-12 DIAGNOSIS — R0602 Shortness of breath: Secondary | ICD-10-CM | POA: Diagnosis present

## 2024-07-12 DIAGNOSIS — Z7902 Long term (current) use of antithrombotics/antiplatelets: Secondary | ICD-10-CM | POA: Diagnosis not present

## 2024-07-12 DIAGNOSIS — Z7982 Long term (current) use of aspirin: Secondary | ICD-10-CM | POA: Insufficient documentation

## 2024-07-12 DIAGNOSIS — Z7951 Long term (current) use of inhaled steroids: Secondary | ICD-10-CM | POA: Diagnosis not present

## 2024-07-12 DIAGNOSIS — Z79899 Other long term (current) drug therapy: Secondary | ICD-10-CM | POA: Diagnosis not present

## 2024-07-12 DIAGNOSIS — R531 Weakness: Secondary | ICD-10-CM | POA: Diagnosis not present

## 2024-07-12 DIAGNOSIS — R630 Anorexia: Secondary | ICD-10-CM | POA: Insufficient documentation

## 2024-07-12 LAB — RESP PANEL BY RT-PCR (RSV, FLU A&B, COVID)  RVPGX2
Influenza A by PCR: NEGATIVE
Influenza B by PCR: NEGATIVE
Resp Syncytial Virus by PCR: NEGATIVE
SARS Coronavirus 2 by RT PCR: NEGATIVE

## 2024-07-12 LAB — URINALYSIS, ROUTINE W REFLEX MICROSCOPIC
Bilirubin Urine: NEGATIVE
Glucose, UA: NEGATIVE mg/dL
Hgb urine dipstick: NEGATIVE
Ketones, ur: NEGATIVE mg/dL
Leukocytes,Ua: NEGATIVE
Nitrite: NEGATIVE
Protein, ur: NEGATIVE mg/dL
Specific Gravity, Urine: 1.011 (ref 1.005–1.030)
pH: 5 (ref 5.0–8.0)

## 2024-07-12 LAB — COMPREHENSIVE METABOLIC PANEL WITH GFR
ALT: 26 U/L (ref 0–44)
AST: 57 U/L — ABNORMAL HIGH (ref 15–41)
Albumin: 4.3 g/dL (ref 3.5–5.0)
Alkaline Phosphatase: 55 U/L (ref 38–126)
Anion gap: 16 — ABNORMAL HIGH (ref 5–15)
BUN: 68 mg/dL — ABNORMAL HIGH (ref 8–23)
CO2: 29 mmol/L (ref 22–32)
Calcium: 9.9 mg/dL (ref 8.9–10.3)
Chloride: 96 mmol/L — ABNORMAL LOW (ref 98–111)
Creatinine, Ser: 1.84 mg/dL — ABNORMAL HIGH (ref 0.44–1.00)
GFR, Estimated: 29 mL/min — ABNORMAL LOW
Glucose, Bld: 94 mg/dL (ref 70–99)
Potassium: 3.9 mmol/L (ref 3.5–5.1)
Sodium: 141 mmol/L (ref 135–145)
Total Bilirubin: 0.6 mg/dL (ref 0.0–1.2)
Total Protein: 7.8 g/dL (ref 6.5–8.1)

## 2024-07-12 LAB — CBC
HCT: 44.9 % (ref 36.0–46.0)
Hemoglobin: 15 g/dL (ref 12.0–15.0)
MCH: 30.2 pg (ref 26.0–34.0)
MCHC: 33.4 g/dL (ref 30.0–36.0)
MCV: 90.5 fL (ref 80.0–100.0)
Platelets: 241 K/uL (ref 150–400)
RBC: 4.96 MIL/uL (ref 3.87–5.11)
RDW: 13.2 % (ref 11.5–15.5)
WBC: 8.7 K/uL (ref 4.0–10.5)
nRBC: 0 % (ref 0.0–0.2)

## 2024-07-12 MED ORDER — SODIUM CHLORIDE 0.9 % IV BOLUS
250.0000 mL | Freq: Once | INTRAVENOUS | Status: AC
  Administered 2024-07-12: 250 mL via INTRAVENOUS

## 2024-07-12 NOTE — ED Triage Notes (Addendum)
 Pt arrived via [facility] from home for c/o generalized weakness and decreased strength since Christmas. Pt c/o frequent urinationx2wks. Pt has left sided deficits from prior stroke. EMS placed pt on 2L 02 per [place] due to her looking like she was SOB

## 2024-07-12 NOTE — ED Provider Notes (Signed)
 " [facility] Provider Note   CSN: [id number] Arrival date & time: 07/12/24  1339     Patient presents with: Weakness   Wanda Harrington is a 74 y.o. female.  Patient with past medical history significant for HTN, COPD, and previous CVA presents to the ED with generalized weakness and fatigue. Patient reports progressive weakness since Christmas. Patient has left sided motor deficits form previous stroke but reports no changes from her baseline. Patient denies illness or fevers at onset of weakness and fatigue. Patient denies SOB or chest pain. Patient does not require at home O2 for her COPD. Patient denies headaches or changes to vision. Patient states she has had decreased appetite. States in the past five days she has only eaten one sandwich. Patient is concerned she has the flu. No known sick contacts. Patient states she normally ambulatory without assistive devises but endorses decreased energy. Denies dizziness. Reports frequent urination. No hematuria or dysuria.  She has an appointment scheduled with primary care on Saturday for further evaluation of the same complaints.    Weakness      Prior to Admission medications  Medication Sig Start Date End Date Taking? Authorizing Provider  acetaminophen  (TYLENOL ) 325 MG tablet Take 650 mg by mouth every 6 (six) hours as needed for moderate pain.    [provider]  ALPRAZolam  (XANAX ) 0.25 MG tablet Take 0.25 mg by mouth 2 (two) times daily as needed for anxiety. 06/28/21   [provider]  anastrozole  (ARIMIDEX ) 1 MG tablet Take 1 tablet (1 mg total) by mouth daily. 07/31/21   Iruku, Praveena, MD  aspirin  EC 81 MG tablet Take 81 mg by mouth daily. Swallow whole.    [provider]  atorvastatin  (LIPITOR ) 80 MG tablet Take 1 tablet (80 mg total) by mouth daily. 09/08/21   Whitfield Raisin, NP  cetirizine (ZYRTEC) 10 MG tablet Take 10 mg by mouth daily as needed for allergies.     [provider]  cholecalciferol (VITAMIN D3) 25 MCG (1000 UNIT) tablet Take 1,000 Units by mouth daily.    [provider]  clopidogrel  (PLAVIX ) 75 MG tablet TAKE 1 TABLET(75 MG) BY MOUTH DAILY 08/24/23   Serene Gaile ORN, MD  docusate sodium  (COLACE) 100 MG capsule Take 1 capsule (100 mg total) by mouth daily. 02/20/22   Gerome Maurilio HERO, PA-C  ezetimibe  (ZETIA ) 10 MG tablet Take 1 tablet (10 mg total) by mouth daily. 11/23/21   Rhyne, Samantha J, PA-C  fluticasone (FLONASE) 50 MCG/ACT nasal spray Place 1 spray into both nostrils daily.    [provider]  Fluticasone-Umeclidin-Vilant (TRELEGY ELLIPTA) 100-62.5-25 MCG/ACT AEPB Inhale into the lungs.    [provider]  lisinopril -hydrochlorothiazide  (ZESTORETIC ) 10-12.5 MG tablet Take 1 tablet by mouth daily. 03/05/22   [provider]  loratadine  (CLARITIN ) 10 MG tablet Take 10 mg by mouth daily as needed for allergies.    [provider]  ondansetron  (ZOFRAN ) 4 MG tablet Take 1 tablet (4 mg total) by mouth every 8 (eight) hours as needed for nausea or vomiting. 11/23/21   Rhyne, Samantha J, PA-C  ondansetron  (ZOFRAN ) 4 MG tablet Take 1 tablet (4 mg total) by mouth daily as needed for nausea or vomiting. 02/19/22   Gerome Maurilio HERO, PA-C  oxyCODONE -acetaminophen  (PERCOCET) 5-325 MG tablet Take 1 tablet by mouth every 6 (six) hours as needed for severe pain. 11/23/21   Rhyne, Lucie PARAS, PA-C  oxyCODONE -acetaminophen  (PERCOCET/ROXICET) 5-325 MG tablet  Take 1 tablet by mouth every 6 (six) hours as needed for moderate pain. 02/19/22   Gerome Maurilio HERO, PA-C  polyethylene glycol (MIRALAX  / GLYCOLAX ) 17 g packet Take 17 g by mouth daily as needed for moderate constipation.    [provider]  pregabalin  (LYRICA ) 50 MG capsule Take 50 mg by mouth 2 (two) times daily.    [provider]  sulfamethoxazole -trimethoprim  (BACTRIM  DS) 800-160 MG tablet Take 1 tablet by mouth 2 (two) times daily.  02/16/22   Serene Gaile ORN, MD  torsemide (DEMADEX) 5 MG tablet Take 5 mg by mouth every morning. 06/01/23   [provider]    Allergies: Motrin [ibuprofen], Other, Aleve [naproxen], and Latex    Review of Systems  Neurological:  Positive for weakness.    Updated Vital Signs BP 128/69   Pulse 89   Temp 98.1 F (36.7 C) (Oral)   Resp 18   SpO2 99%   Physical Exam Vitals and nursing note reviewed.  Constitutional:      General: She is not in acute distress.    Appearance: She is well-developed.  HENT:     Head: Normocephalic and atraumatic.  Eyes:     Conjunctiva/sclera: Conjunctivae normal.  Cardiovascular:     Rate and Rhythm: Normal rate and regular rhythm.  Pulmonary:     Effort: Pulmonary effort is normal. No respiratory distress.     Breath sounds: Normal breath sounds.  Abdominal:     Palpations: Abdomen is soft.     Tenderness: There is no abdominal tenderness.  Musculoskeletal:        General: No swelling.     Cervical back: Neck supple.     Right lower leg: No edema.     Left lower leg: No edema.  Skin:    General: Skin is warm and dry.     Capillary Refill: Capillary refill takes less than 2 seconds.  Neurological:     Mental Status: She is alert. Mental status is at baseline.  Psychiatric:        Mood and Affect: Mood normal.     (all labs ordered are listed, but only abnormal results are displayed) Labs Reviewed  COMPREHENSIVE METABOLIC PANEL WITH GFR - Abnormal; Notable for the following components:      Result Value   Chloride 96 (*)    BUN 68 (*)    Creatinine, Ser 1.84 (*)    AST 57 (*)    GFR, Estimated 29 (*)    Anion gap 16 (*)    All other components within normal limits  RESP PANEL BY RT-PCR (RSV, FLU A&B, COVID)  RVPGX2  CBC  URINALYSIS, ROUTINE W REFLEX MICROSCOPIC  CBG MONITORING, ED    EKG: None  Radiology: DG Chest 2 View Result Date: 07/12/2024 CLINICAL DATA:  Shortness of breath. EXAM: CHEST - 2 VIEW  COMPARISON:  07/28/2018 FINDINGS: . left-sided Port-A-Cath in similar position. No focal consolidation, pleural effusion or pneumothorax. Stable cardiac silhouette. No acute osseous pathology. IMPRESSION: No active cardiopulmonary disease. Electronically Signed   By: Vanetta Chou M.D.   On: 07/12/2024 17:47     Procedures   Medications Ordered in the ED  sodium chloride  0.9 % bolus 250 mL (250 mLs Intravenous New Bag/Given 07/12/24 2337)                                    Medical Decision  Making Amount and/or Complexity of Data Reviewed Labs: ordered.   This patient presents to the ED for concern of decreased energy, this involves an extensive number of treatment options, and is a complaint that carries with it a high risk of complications and morbidity.  The differential diagnosis includes infection, intra-abdominal etiology, failure to thrive, deconditioning, others   Co morbidities / Chronic conditions that complicate the patient evaluation  As noted in HPI   Additional history obtained:  Additional history obtained from EMR  I also spoke by phone with patient's roommate who is worried that patient may be showing signs of failure to thrive with decreased oral intake.  She states that the patient has decreased energy and sits on the couch watching TV all day.   Lab Tests:  I Ordered, and personally interpreted labs.  The pertinent results include: Creatinine 1.84 appears to be at patient's baseline, unremarkable UA   Imaging Studies ordered:  I ordered imaging studies including chest x-ray I independently visualized and interpreted imaging which showed no acute findings I agree with the radiologist interpretation   Cardiac Monitoring: / EKG:  The patient was maintained on a cardiac monitor.  I personally viewed and interpreted the cardiac monitored which showed an underlying rhythm of: Sinus rhythm   Problem List / ED Course / Critical interventions / Medication  management   I ordered medication including saline bolus Reevaluation of the patient after these medicines showed that the patient improved I have reviewed the patients home medicines and have made adjustments as needed    Social Determinants of Health:  Patient is an occasional smoker   Test / Admission - Considered:  Patient with no significant acute findings on lab work or imaging this evening.  She has no abdominal tenderness to suggest an intra-abdominal etiology of her decreased appetite.  She is tolerating oral intake at this time without difficulty.  She is able to ambulate and is requiring no supplemental oxygen.  At this time I see no indication for further emergent workup or admission.  Patient has a scheduled primary care appointment for further evaluation of the symptoms on Saturday.  I have recommended that the patient keep this appointment.  Patient stable for discharge home with return precautions provided.      Final diagnoses:  Decreased appetite  Decreased energy    ED Discharge Orders     None          Wanda Harrington 07/12/24 2345    Ruthe Cornet, DO 07/12/24 2345  "

## 2024-07-12 NOTE — ED Notes (Signed)
 Called susan roberts to given update.

## 2024-07-12 NOTE — ED Provider Triage Note (Signed)
 Emergency Medicine Provider Triage Evaluation Note  Wanda Harrington , a 74 y.o. female  was evaluated in triage.  Pt complains of generalized weakness, flulike symptoms x 3 weeks.  Gradually worsening.  Decreased p.o. intake, but no abdominal pain.  Having some short of breath, no chest pain  Review of Systems  Positive: SHOB Negative: CP  Physical Exam  BP (!) 104/55 (BP Location: Left Arm)   Pulse (!) 104   Temp (!) 97.5 F (36.4 C) (Oral)   Resp 16   SpO2 92%  Gen:   Awake, no distress   Resp:  Normal effort MSK:   Moves extremities without difficulty  Other:  No lower extremity edema  Medical Decision Making  Medically screening exam initiated at 4:36 PM.  Appropriate orders placed.  Wanda Harrington was informed that the remainder of the evaluation will be completed by another provider, this initial triage assessment does not replace that evaluation, and the importance of remaining in the ED until their evaluation is complete.     Wanda Harrington PARAS, DO 07/12/24 1636

## 2024-07-12 NOTE — ED Notes (Signed)
 Patient's RX and phone left at her home by EMS; please call her contact Devere Rummer Friend Emergency Contact [phone]

## 2024-07-12 NOTE — Discharge Instructions (Signed)
 Your lab work and imaging today were reassuring. Please keep your upcoming appointment with your primary care provider for further evaluation. Return to the emergency department if you develop any life threatening symptoms.
